# Patient Record
Sex: Female | Born: 1955 | Race: White | Hispanic: No | Marital: Married | Smoking: Current every day smoker
Health system: Southern US, Community
[De-identification: ages and names within clinical notes are randomized; demographics above are authoritative.]

## PROBLEM LIST (undated history)

## (undated) DIAGNOSIS — I219 Acute myocardial infarction, unspecified: Secondary | ICD-10-CM

## (undated) DIAGNOSIS — I10 Essential (primary) hypertension: Secondary | ICD-10-CM

## (undated) DIAGNOSIS — I509 Heart failure, unspecified: Secondary | ICD-10-CM

## (undated) DIAGNOSIS — G8929 Other chronic pain: Secondary | ICD-10-CM

## (undated) DIAGNOSIS — R51 Headache: Secondary | ICD-10-CM

## (undated) DIAGNOSIS — R519 Headache, unspecified: Secondary | ICD-10-CM

## (undated) DIAGNOSIS — I2510 Atherosclerotic heart disease of native coronary artery without angina pectoris: Secondary | ICD-10-CM

## (undated) HISTORY — PX: TUBAL LIGATION: SHX77

## (undated) HISTORY — PX: CARDIAC CATHETERIZATION: SHX172

## (undated) HISTORY — PX: CARDIAC ELECTROPHYSIOLOGY STUDY AND ABLATION: SHX1294

---

## 2007-04-29 ENCOUNTER — Ambulatory Visit (HOSPITAL_COMMUNITY): Admission: RE | Admit: 2007-04-29 | Discharge: 2007-04-29 | Payer: Self-pay | Admitting: Nurse Practitioner

## 2009-03-14 ENCOUNTER — Ambulatory Visit: Payer: Self-pay | Admitting: Gastroenterology

## 2013-11-10 ENCOUNTER — Encounter (HOSPITAL_COMMUNITY): Payer: Self-pay | Admitting: Emergency Medicine

## 2013-11-10 ENCOUNTER — Emergency Department (HOSPITAL_COMMUNITY)
Admission: EM | Admit: 2013-11-10 | Discharge: 2013-11-10 | Disposition: A | Discharge status: Home / Self Care | Payer: BC Managed Care – PPO | Attending: Emergency Medicine | Admitting: Emergency Medicine

## 2013-11-10 ENCOUNTER — Emergency Department (HOSPITAL_COMMUNITY): Payer: BC Managed Care – PPO

## 2013-11-10 DIAGNOSIS — I252 Old myocardial infarction: Secondary | ICD-10-CM | POA: Insufficient documentation

## 2013-11-10 DIAGNOSIS — Z9114 Patient's other noncompliance with medication regimen: Secondary | ICD-10-CM

## 2013-11-10 DIAGNOSIS — Z79899 Other long term (current) drug therapy: Secondary | ICD-10-CM | POA: Insufficient documentation

## 2013-11-10 DIAGNOSIS — I10 Essential (primary) hypertension: Secondary | ICD-10-CM | POA: Insufficient documentation

## 2013-11-10 DIAGNOSIS — Z9119 Patient's noncompliance with other medical treatment and regimen: Secondary | ICD-10-CM | POA: Insufficient documentation

## 2013-11-10 DIAGNOSIS — I2510 Atherosclerotic heart disease of native coronary artery without angina pectoris: Secondary | ICD-10-CM | POA: Insufficient documentation

## 2013-11-10 DIAGNOSIS — R51 Headache: Secondary | ICD-10-CM | POA: Insufficient documentation

## 2013-11-10 DIAGNOSIS — Z9889 Other specified postprocedural states: Secondary | ICD-10-CM | POA: Insufficient documentation

## 2013-11-10 DIAGNOSIS — Z91199 Patient's noncompliance with other medical treatment and regimen due to unspecified reason: Secondary | ICD-10-CM | POA: Insufficient documentation

## 2013-11-10 DIAGNOSIS — G8929 Other chronic pain: Secondary | ICD-10-CM | POA: Insufficient documentation

## 2013-11-10 DIAGNOSIS — F17200 Nicotine dependence, unspecified, uncomplicated: Secondary | ICD-10-CM | POA: Insufficient documentation

## 2013-11-10 DIAGNOSIS — I509 Heart failure, unspecified: Secondary | ICD-10-CM | POA: Insufficient documentation

## 2013-11-10 HISTORY — DX: Heart failure, unspecified: I50.9

## 2013-11-10 HISTORY — DX: Headache: R51

## 2013-11-10 HISTORY — DX: Other chronic pain: G89.29

## 2013-11-10 HISTORY — DX: Headache, unspecified: R51.9

## 2013-11-10 HISTORY — DX: Acute myocardial infarction, unspecified: I21.9

## 2013-11-10 HISTORY — DX: Atherosclerotic heart disease of native coronary artery without angina pectoris: I25.10

## 2013-11-10 HISTORY — DX: Essential (primary) hypertension: I10

## 2013-11-10 LAB — CBC WITH DIFFERENTIAL/PLATELET
Basophils Absolute: 0.1 10*3/uL (ref 0.0–0.1)
Basophils Relative: 1 % (ref 0–1)
EOS PCT: 1 % (ref 0–5)
Eosinophils Absolute: 0.1 10*3/uL (ref 0.0–0.7)
HEMATOCRIT: 40.1 % (ref 36.0–46.0)
HEMOGLOBIN: 13.9 g/dL (ref 12.0–15.0)
LYMPHS ABS: 1.8 10*3/uL (ref 0.7–4.0)
LYMPHS PCT: 25 % (ref 12–46)
MCH: 36.3 pg — ABNORMAL HIGH (ref 26.0–34.0)
MCHC: 34.7 g/dL (ref 30.0–36.0)
MCV: 104.7 fL — AB (ref 78.0–100.0)
MONO ABS: 0.4 10*3/uL (ref 0.1–1.0)
MONOS PCT: 6 % (ref 3–12)
Neutro Abs: 4.7 10*3/uL (ref 1.7–7.7)
Neutrophils Relative %: 67 % (ref 43–77)
PLATELETS: 229 10*3/uL (ref 150–400)
RBC: 3.83 MIL/uL — AB (ref 3.87–5.11)
RDW: 14.3 % (ref 11.5–15.5)
WBC: 7.1 10*3/uL (ref 4.0–10.5)

## 2013-11-10 LAB — URINALYSIS, ROUTINE W REFLEX MICROSCOPIC
Bilirubin Urine: NEGATIVE
Glucose, UA: NEGATIVE mg/dL
HGB URINE DIPSTICK: NEGATIVE
Ketones, ur: NEGATIVE mg/dL
Leukocytes, UA: NEGATIVE
Nitrite: NEGATIVE
Protein, ur: NEGATIVE mg/dL
SPECIFIC GRAVITY, URINE: 1.01 (ref 1.005–1.030)
UROBILINOGEN UA: 0.2 mg/dL (ref 0.0–1.0)
pH: 5.5 (ref 5.0–8.0)

## 2013-11-10 LAB — TROPONIN I

## 2013-11-10 LAB — COMPREHENSIVE METABOLIC PANEL
ALBUMIN: 3.9 g/dL (ref 3.5–5.2)
ALT: 26 U/L (ref 0–35)
AST: 51 U/L — ABNORMAL HIGH (ref 0–37)
Alkaline Phosphatase: 74 U/L (ref 39–117)
BUN: 11 mg/dL (ref 6–23)
CO2: 28 mEq/L (ref 19–32)
CREATININE: 0.86 mg/dL (ref 0.50–1.10)
Calcium: 9.6 mg/dL (ref 8.4–10.5)
Chloride: 97 mEq/L (ref 96–112)
GFR calc Af Amer: 87 mL/min — ABNORMAL LOW (ref >90)
GFR calc non Af Amer: 75 mL/min — ABNORMAL LOW (ref >90)
Glucose, Bld: 106 mg/dL — ABNORMAL HIGH (ref 70–99)
Potassium: 3.2 mEq/L — ABNORMAL LOW (ref 3.7–5.3)
Sodium: 140 mEq/L (ref 137–147)
TOTAL PROTEIN: 8 g/dL (ref 6.0–8.3)
Total Bilirubin: 0.4 mg/dL (ref 0.3–1.2)

## 2013-11-10 LAB — PRO B NATRIURETIC PEPTIDE: Pro B Natriuretic peptide (BNP): 365.3 pg/mL — ABNORMAL HIGH (ref 0–125)

## 2013-11-10 MED ORDER — KETOROLAC TROMETHAMINE 60 MG/2ML IM SOLN
30.0000 mg | Freq: Once | INTRAMUSCULAR | Status: AC
  Administered 2013-11-10: 30 mg via INTRAMUSCULAR
  Filled 2013-11-10: qty 2

## 2013-11-10 MED ORDER — METOCLOPRAMIDE HCL 5 MG/ML IJ SOLN
10.0000 mg | Freq: Once | INTRAMUSCULAR | Status: AC
  Administered 2013-11-10: 10 mg via INTRAMUSCULAR
  Filled 2013-11-10: qty 2

## 2013-11-10 MED ORDER — OXYCODONE-ACETAMINOPHEN 5-325 MG PO TABS
2.0000 | ORAL_TABLET | Freq: Once | ORAL | Status: AC
  Administered 2013-11-10: 2 via ORAL
  Filled 2013-11-10: qty 2

## 2013-11-10 MED ORDER — OXYCODONE-ACETAMINOPHEN 5-325 MG PO TABS: ORAL_TABLET | ORAL | Status: DC

## 2013-11-10 MED ORDER — PROMETHAZINE HCL 25 MG PO TABS: 25.0000 mg | ORAL_TABLET | Freq: Four times a day (QID) | ORAL | Status: DC | PRN

## 2013-11-10 MED ORDER — POTASSIUM CHLORIDE 20 MEQ/15ML (10%) PO LIQD
40.0000 meq | Freq: Once | ORAL | Status: AC
  Administered 2013-11-10: 40 meq via ORAL
  Filled 2013-11-10: qty 30

## 2013-11-10 MED ORDER — FUROSEMIDE 40 MG PO TABS: 20.0000 mg | ORAL_TABLET | Freq: Once | ORAL | Status: DC

## 2013-11-10 MED ORDER — DIPHENHYDRAMINE HCL 50 MG/ML IJ SOLN
50.0000 mg | Freq: Once | INTRAMUSCULAR | Status: AC
  Administered 2013-11-10: 50 mg via INTRAMUSCULAR
  Filled 2013-11-10: qty 1

## 2013-11-10 NOTE — ED Provider Notes (Signed)
CSN: [id number]     Arrival date & time 11/10/13  1335 History   First MD Initiated Contact with Patient 11/10/13 1453     Chief Complaint  Patient presents with  . Hypertension     HPI Pt was seen at 1455.  Per pt, c/o gradual onset and persistence of constant acute flair of her chronic "headache" for the past several months, worse over the past several weeks. Describes the headache as per her usual chronic headache pain pattern "for as long as I can remember." Describes the headache as "dull," located on her vertex scalp and forehead areas.  Denies headache was sudden or maximal in onset or at any time. Denies taking any pain meds for her headache. Denies visual changes, no focal motor weakness, no tingling/numbness in extremities, no fevers, no neck pain, no rash. States she has also noticed her home BP "has been high" for the past several weeks, and her bilat ankles have been "swollen" for the past several months. States she has been taking her BP daily in the evening "while watching TV." Admits to not taking her BP meds as prescribed: not taking her lasix and "either takes a whole or a half tablet of losartan depending on how I feel." States her ankle swelling increases when she is "on my feet a lot" and improves with elevation and "when I take my water pill." States she has not been wearing her compression stockings "for a while now." Pt endorses she has not called her PMD to be evaluated for these ongoing/chronic medical issues. Denies CP/palpitations, no SOB/cough, no abd pain, no N/V/D, no calf/LE pain or unilateral swelling.     PMD: Dr. Wendy Poet [place], [place]) Past Medical History  Diagnosis Date  . Hypertension   . CHF (congestive heart failure)   . Coronary artery disease   . Myocardial infarction   . Chronic headaches     "for as long as I can remember"   Past Surgical History  Procedure Laterality Date  . Tubal ligation    . Cardiac electrophysiology study and  ablation    . Cardiac catheterization      History  Substance Use Topics  . Smoking status: Current Every Day Smoker  . Smokeless tobacco: Not on file  . Alcohol Use: Yes    Review of Systems ROS: Statement: All systems negative except as marked or noted in the HPI; Constitutional: Negative for fever and chills. ; ; Eyes: Negative for eye pain, redness and discharge. ; ; ENMT: Negative for ear pain, hoarseness, nasal congestion, sinus pressure and sore throat. ; ; Cardiovascular: Negative for chest pain, palpitations, diaphoresis, dyspnea and +peripheral edema. ; ; Respiratory: Negative for cough, wheezing and stridor. ; ; Gastrointestinal: Negative for nausea, vomiting, diarrhea, abdominal pain, blood in stool, hematemesis, jaundice and rectal bleeding. . ; ; Genitourinary: Negative for dysuria, flank pain and hematuria. ; ; Musculoskeletal: Negative for back pain and neck pain. Negative for swelling and trauma.; ; Skin: Negative for pruritus, rash, abrasions, blisters, bruising and skin lesion.; ; Neuro: +headache. Negative for lightheadedness and neck stiffness. Negative for weakness, altered level of consciousness , altered mental status, extremity weakness, paresthesias, involuntary movement, seizure and syncope.     Allergies  Review of patient's allergies indicates no known allergies.  Home Medications   Current Outpatient Rx  Name  Route  Sig  Dispense  Refill  . acetaminophen (TYLENOL) 500 MG tablet   Oral   Take 500 mg by  mouth every 6 (six) hours as needed for mild pain or moderate pain.         . Aspirin-Salicylamide-Caffeine (BC HEADACHE) 325-95-16 MG TABS   Oral   Take 1-2 packets by mouth daily as needed (for pain).         . furosemide (LASIX) 20 MG tablet   Oral   Take 20 mg by mouth daily.         Marland Kitchen. losartan (COZAAR) 100 MG tablet   Oral   Take 100 mg by mouth daily.          BP 210/125  Pulse 115  Temp(Src) 97.6 F (36.4 C) (Oral)  Resp 22  Ht 6'  (1.829 m)  Wt 230 lb (104.327 kg)  BMI 31.19 kg/m2  SpO2 98% BP 167/104  Pulse 84  Temp(Src) 97.6 F (36.4 C) (Oral)  Resp 20  Ht 6' (1.829 m)  Wt 230 lb (104.327 kg)  BMI 31.19 kg/m2  SpO2 97%  Physical Exam 1500: Physical examination:  Nursing notes reviewed; Vital signs and O2 SAT reviewed;  Constitutional: Well developed, Well nourished, Well hydrated, In no acute distress; Head:  Normocephalic, atraumatic; Eyes: EOMI, PERRL, No scleral icterus; ENMT: TM's clear bilat. +edemetous nasal turbinates bilat with clear rhinorrhea. Mouth and pharynx normal, Mucous membranes moist; Neck: Supple, Full range of motion, No lymphadenopathy; Cardiovascular: Regular rate and rhythm, No gallop; Respiratory: Breath sounds clear & equal bilaterally, No rales, rhonchi, wheezes.  Speaking full sentences with ease, Normal respiratory effort/excursion; Chest: Nontender, Movement normal; Abdomen: Soft, Nontender, Nondistended, Normal bowel sounds; Genitourinary: No CVA tenderness; Extremities: Pulses normal, No tenderness, +1 pedal edema to ankles bilat without calf asymmetry.; Neuro: AA&Ox3, Major CN grossly intact.Speech clear.  No facial droop.  No nystagmus. Grips equal. Strength 5/5 equal bilat UE's and LE's.  DTR 2/4 equal bilat UE's and LE's.  No gross sensory deficits.  Normal cerebellar testing bilat UE's (finger-nose) and LE's (heel-shin). Climbs on and off stretcher easily by herself. Gait steady..; Skin: Color normal, Warm, Dry.   ED Course  Procedures    EKG Interpretation   Date/Time:  Friday November 10 2013 13:42:59 EST Ventricular Rate:  83 PR Interval:  142 QRS Duration: 74 QT Interval:  378 QTC Calculation: 444 R Axis:   42 Text Interpretation:  Normal sinus rhythm Normal ECG No previous ECGs  available Confirmed by [facility]  MD, Nicholos JohnsKATHLEEN [phone]([id number]) on 11/10/2013 3:15:51  PM      MDM  MDM Reviewed: previous chart, nursing note and vitals Interpretation: labs, ECG, x-ray and CT  scan   Results for orders placed during the hospital encounter of 11/10/13  COMPREHENSIVE METABOLIC PANEL      Result Value Ref Range   Sodium 140  137 - 147 mEq/L   Potassium 3.2 (*) 3.7 - 5.3 mEq/L   Chloride 97  96 - 112 mEq/L   CO2 28  19 - 32 mEq/L   Glucose, Bld 106 (*) 70 - 99 mg/dL   BUN 11  6 - 23 mg/dL   Creatinine, Ser 6.040.86  0.50 - 1.10 mg/dL   Calcium 9.6  8.4 - 54.010.5 mg/dL   Total Protein 8.0  6.0 - 8.3 g/dL   Albumin 3.9  3.5 - 5.2 g/dL   AST 51 (*) 0 - 37 U/L   ALT 26  0 - 35 U/L   Alkaline Phosphatase 74  39 - 117 U/L   Total Bilirubin 0.4  0.3 - 1.2 mg/dL  GFR calc non Af Amer 75 (*) >90 mL/min   GFR calc Af Amer 87 (*) >90 mL/min  CBC WITH DIFFERENTIAL      Result Value Ref Range   WBC 7.1  4.0 - 10.5 K/uL   RBC 3.83 (*) 3.87 - 5.11 MIL/uL   Hemoglobin 13.9  12.0 - 15.0 g/dL   HCT 72.5  36.6 - 44.0 %   MCV 104.7 (*) 78.0 - 100.0 fL   MCH 36.3 (*) 26.0 - 34.0 pg   MCHC 34.7  30.0 - 36.0 g/dL   RDW 34.7  42.5 - 95.6 %   Platelets 229  150 - 400 K/uL   Neutrophils Relative % 67  43 - 77 %   Neutro Abs 4.7  1.7 - 7.7 K/uL   Lymphocytes Relative 25  12 - 46 %   Lymphs Abs 1.8  0.7 - 4.0 K/uL   Monocytes Relative 6  3 - 12 %   Monocytes Absolute 0.4  0.1 - 1.0 K/uL   Eosinophils Relative 1  0 - 5 %   Eosinophils Absolute 0.1  0.0 - 0.7 K/uL   Basophils Relative 1  0 - 1 %   Basophils Absolute 0.1  0.0 - 0.1 K/uL  TROPONIN I      Result Value Ref Range   Troponin I <0.30  <0.30 ng/mL  PRO B NATRIURETIC PEPTIDE      Result Value Ref Range   Pro B Natriuretic peptide (BNP) 365.3 (*) 0 - 125 pg/mL  URINALYSIS, ROUTINE W REFLEX MICROSCOPIC      Result Value Ref Range   Color, Urine YELLOW  YELLOW   APPearance CLEAR  CLEAR   Specific Gravity, Urine 1.010  1.005 - 1.030   pH 5.5  5.0 - 8.0   Glucose, UA NEGATIVE  NEGATIVE mg/dL   Hgb urine dipstick NEGATIVE  NEGATIVE   Bilirubin Urine NEGATIVE  NEGATIVE   Ketones, ur NEGATIVE  NEGATIVE mg/dL   Protein,  ur NEGATIVE  NEGATIVE mg/dL   Urobilinogen, UA 0.2  0.0 - 1.0 mg/dL   Nitrite NEGATIVE  NEGATIVE   Leukocytes, UA NEGATIVE  NEGATIVE   Dg Chest 2 View 11/10/2013   CLINICAL DATA:  Cough, congestion shortness of breath.  EXAM: CHEST  2 VIEW  COMPARISON:  None.  FINDINGS: Heart size and mediastinal contours are within normal limits. Both lungs are clear. Visualized skeletal structures are unremarkable.  IMPRESSION: Negative exam.   Electronically Signed   By: Drusilla Kanner M.D.   On: 11/10/2013 14:07   Ct Head Wo Contrast 11/10/2013   CLINICAL DATA:  Elevated blood pressure.  Hypertension.  EXAM: CT HEAD WITHOUT CONTRAST  TECHNIQUE: Contiguous axial images were obtained from the base of the skull through the vertex without intravenous contrast.  COMPARISON:  None.  FINDINGS: No mass lesion, mass effect, midline shift, hydrocephalus, hemorrhage. No territorial ischemia or acute infarction. Right-sided nasal septal spur are incidentally noted.  IMPRESSION: Negative CT head.   Electronically Signed   By: Andreas Newport M.D.   On: 11/10/2013 15:28     1805:  Endorses she "took all my meds today" PTA because she was "concerned about my blood pressure." Pt's BP slowly improving after pain meds. Neuro exam remains intact and unchanged, has been ambulatory with steady gait. Has tol PO well without N/V. States she is ready to go home now. Pt strongly encouraged to keep a BP diary, take her BP meds as prescribed, and f/u regularly  with her PMD for good continuity of care and control of her chronic medical issues. Dx and testing d/w pt and family.  Questions answered.  Verb understanding, agreeable to d/c home with outpt f/u.       Laray Anger, DO 11/13/13 1546

## 2013-11-10 NOTE — ED Notes (Addendum)
Headache for several days intermittently.  Checked her bp and it was elevated.   Swelling of legs

## 2013-11-10 NOTE — Discharge Instructions (Signed)
°Emergency Department Resource Guide °1) Find a Doctor and Pay Out of Pocket °Although you won't have to find out who is covered by your insurance plan, it is a good idea to ask around and get recommendations. You will then need to call the office and see if the doctor you have chosen will accept you as a new patient and what types of options they offer for patients who are self-pay. Some doctors offer discounts or will set up payment plans for their patients who do not have insurance, but you will need to ask so you aren't surprised when you get to your appointment. ° °2) Contact Your Local Health Department °Not all health departments have doctors that can see patients for sick visits, but many do, so it is worth a call to see if yours does. If you don't know where your local health department is, you can check in your phone book. The CDC also has a tool to help you locate your state's health department, and many state websites also have listings of all of their local health departments. ° °3) Find a Walk-in Clinic °If your illness is not likely to be very severe or complicated, you may want to try a walk in clinic. These are popping up all over the country in pharmacies, drugstores, and shopping centers. They're usually staffed by nurse practitioners or physician assistants that have been trained to treat common illnesses and complaints. They're usually fairly quick and inexpensive. However, if you have serious medical issues or chronic medical problems, these are probably not your best option. ° °No Primary Care Doctor: °- Call [facility] Connect at  832-8000 - they can help you locate a primary care doctor that  accepts your insurance, provides certain services, etc. °- Physician Referral Service- 1-800-533-3463 ° °Chronic Pain Problems: °Organization         Address  Phone   Notes  °[facility]  ([phone]) [phone] Patients need to be referred by their primary care doctor.  ° °Medication  Assistance: °Organization         Address  Phone   Notes  °[place] [facility] [address] °[place], [place] [address] (336) 641-8030 --Must be a resident of [place] °-- Must have NO insurance coverage whatsoever (no Medicaid/ Medicare, etc.) °-- The pt. MUST have a primary care doctor that directs their care regularly and follows them in the community °  °[facility]  (866) 331-1348   °[employer]  (888) 892-1162   ° °Agencies that provide inexpensive medical care: °Organization         Address  Phone   Notes  °[facility]  (336) 832-8035   °[facility]    (336) 832-7272   °[facility] [address] °[place], [place] [address] (336) 832-4777   °[facility] [address], °[place] (336) 271-4999   °[facility]    (336) 373-0678   °[facility]    (336) 272-1050   °[facility] ° [address], [place] Phone:  (336) 832-4444, Fax:  (336) 832-4440 Hours of Operation:  9 am - 6 pm, M-F.  Also accepts Medicaid/Medicare and self-pay.  °[facility] ° [address], [place] Phone: (336) [phone], Fax: (336) [phone]. Hours of Operation:  8:30 am - 5:30 pm, M-F.  Also accepts Medicaid and self-pay.  °[facility] [place] [address], [place] Phone: (336) 878-6027   °[facility] [address], [place], [place] (336)723-1848, Ext. 123 Mondays & Thursdays: 7-9 AM.  First 15 patients are seen on a first come, first serve basis. °  ° °Medicaid-accepting [place] Providers: ° °Organization         Address  Phone   Notes  °[facility] [address], [place] (336) 641-2100 Also accepts self-pay patients.  °[facility] [address], [place] ° (336) 856-9996   °[facility] [address], [place]  (336) 288-8857   °[facility] [address] [place] [address], [place] (336) 299-7000   °Veita Bland [address], [place]  ° (336) 373-1557 Only accepts [place] Access Medicaid patients after they have their name applied to their card.  ° °Self-Pay (no insurance) in [place]: ° °Organization         Address  Phone   Notes  °Sickle Cell Patients, [facility] [address], [place] (336) 832-1970   °[facility] [address], [place] (336) 832-4400   °[facility] ° [address], [place] (336) 992-4800   °[facility]/Dr. Osei-Bonsu ° [address] [place] [address], [place] or [address], [place] (336) [phone] Phone number for both [place] and [place] locations is the same.  °[facility] [address], [place] (336) 299-0000   °[facility] [address] [place] [address], [place] or [address] (336) 852-7530 °(336) 878-2260   °[facility] [address], [place] (336) 350-1642, phone; (336) 294-5005, fax Sees patients 1st and 3rd Saturday of every month.  Must not qualify for public or private insurance (i.e. Medicaid, Medicare, [place] Health Choice, Veterans' Benefits) • Household income should be no more than 200% of the poverty level •The clinic cannot treat you if you are pregnant or think you are pregnant • Sexually transmitted diseases are not treated at the clinic.  ° ° °Dental Care: °Organization         Address  Phone  Notes  °[place] [facility] [address], [place] (336) 641-6152 Accepts children up to age 21 who are enrolled in Medicaid or [place] Health Choice; pregnant women with a Medicaid card; and children who have applied for Medicaid or [place] Health Choice, but were declined, whose parents can pay a reduced fee at time of service.  °[place]  [facility] [place]  [address], [place] (336) 641-7733 Accepts children up to age 21 who are enrolled in Medicaid or [place] Health Choice; pregnant women with a Medicaid card; and children who have applied for Medicaid or [place] Health Choice, but were declined, whose parents can pay a reduced fee at time of service.  °[facility] ° [address], [place] (336) 641-4533 Patients are seen by appointment only. Walk-ins are not accepted. [facility] will see patients 18 years of age and older. °Monday - Tuesday (8am-5pm) °Most Wednesdays (8:30-5pm) °$30 per visit, cash only  °[facility] ° [address], [place] (336) 641-4533 Patients are seen by appointment only. Walk-ins are not accepted. [facility] will see patients 18 years of age and older. °One   Wednesday Evening (Monthly: Volunteer Based).  $30 per visit, cash only  °[facility]  (919) 537-3737 for adults; Children under age 4, call Graduate Pediatric Dentistry at (919) [phone]. Children aged 4-14, please call (919) [phone] to request a pediatric application. ° Dental services are provided in all areas of dental care including fillings, crowns and bridges, complete and partial dentures, implants, gum treatment, root canals, and extractions. Preventive care is also provided. Treatment is provided to both adults and children. °Patients are selected via a lottery and there is often a waiting list. °  °[facility] [address], °[place] ° (336) 763-8833 [email or web address].[email or web address] °  °[facility] [address], [place], [place] (336)723-1848, Ext. 123 Second and Fourth Thursday of each month, opens at 6:30 AM; Clinic ends at 9 AM.  Patients are seen on a first-come first-served basis, and a limited number are seen during each clinic.  ° °[facility] ° [address], [place], [place] (336) [phone]    Eligibility Requirements °You must have lived in [place], or [place] counties for at least the last three months. °  You cannot be eligible for state or federal sponsored healthcare insurance, including Veterans Administration, Medicaid, or Medicare. °  You generally cannot be eligible for healthcare insurance through your employer.  °  How to apply: °Eligibility screenings are held every Tuesday and Wednesday afternoon from 1:00 pm until 4:00 pm. You do not need an appointment for the interview!  °[facility] [address], [place], [place] 336-631-2330   °[facility]  336-342-8273   °[facility]  336-703-3100   °[facility]  336-570-6415   ° °Behavioral Health Resources in the Community: °Intensive Outpatient Programs °Organization         Address  Phone  Notes  °[facility] [address], [place], [place] 336-878-6098   °[facility] [address], [place], [place] 336-832-9800   °[facility] [address], [place], [place] ° 336-882-2125   °[facility] [address],  °[place], [place] 1-800-853-5163 or 336-641-4981   °Substance Abuse Resources °Organization         Address  Phone  Notes  °[facility]  336-882-2125   °[facility]  336-784-9470   °[facility]  336-285-9073   °[facility]  336-845-3988   °Residential & Outpatient Substance Abuse Program  1-800-659-3381   °Psychological Services °Organization         Address  Phone  Notes  °[facility]  336- 832-9600   °[facility]  336- 378-7881   °[facility] [address], [place] 1-800-853-5163 or 336-641-4981   ° °Mobile Crisis Teams °Organization         Address  Phone  Notes  °[facility], Mobile Crisis Care Unit  1-877-626-1772   °Assertive °[facility] ° [address]  [place], [place] 336-834-9664   °Sharon DeEsch [address] °[place] 336-554-5454   ° °Self-Help/Support Groups °Organization         Address  Phone             Notes  °[facility]  - variety of support groups  336- 373-1402 Call for more information  °Narcotics Anonymous (NA), [facility] [address], °[place]  2 meetings at this location  ° °  Residential Treatment Programs Organization         Address  Phone  Notes  [facility] [address]    [place]  8-828-003-4917   [facility]  [address] [place] [address], [place], [place] 979-480-1655   [facility] [address] [place], [place] 374-827-0786 Admissions: 8am-3pm M-F  [facility] [address],    [place], [place] 754-492-0100   [facility] [address] [place], [place], [place] 712-197-5883   [facility] [address],  [place], [place] 254-982-6415   [facility] Intensive Outpatient [address], [place], [place] 830-940-7680   [facility] ([facility] [address] [place]  [place], [place] 8-811-031-5945 or 740-152-7051   [facility] ([facility] [address], [place], [place] 863-817-7116 Accepts Medicaid  [facility] [address]  [place] 5-790-383-3383 Substance Abuse/Addiction Treatment   [facility]         Address  Phone  Notes  [facility]  (602)681-0222   Angie Fava, PhD [address] [place], [place]   (715)648-4910 or 3047612633   [facility]   [address] [place], [place] 267 148 2029   [facility] [address] [place], [place] [phone] Insurance/Medicaid/sponsorship through [facility] and Families [address]                                    [place], [place] [phone] Therapy/tele-psych/case    [facility] [address] [place] [address], [place] 817-330-6078    Dr. Lolly Mustache  843-214-2630   [facility]  [employer] [facility] Dept. 1) [address] [place] 2) [address] [place] 3)  [address], [place] [phone] 303-083-7858  (224)040-2542   [facility] Child Abuse Hotline 6301841502 or (954) 502-6387 (After Hours)      Take your usual prescriptions as previously directed.  Take your blood pressure only ONCE per day, either in the morning after you take your medicine(s) or in the evening before you go to bed.  Always sit quietly for at least 15 minutes before taking your blood pressure.  Keep a diary of your blood pressures to show your doctor at your follow up office visit. Take over the counter tylenol, ibuprofen (OR excedrin, OR percocet) and benadryl, as directed on packaging, with the anti-nausea prescription given to you today (phenergan), as needed for headache. Do NOT take percocet if you have already taken tylenol because percocet already has tylenol in it.  Call your regular medical doctor on Monday morning to schedule a follow up appointment in the next 3 days.  Return to the Emergency Department immediately sooner if worsening.

## 2016-07-02 ENCOUNTER — Emergency Department (HOSPITAL_COMMUNITY): Payer: Self-pay

## 2016-07-02 ENCOUNTER — Encounter (HOSPITAL_COMMUNITY): Payer: Self-pay | Admitting: Emergency Medicine

## 2016-07-02 ENCOUNTER — Emergency Department (HOSPITAL_COMMUNITY)
Admission: EM | Admit: 2016-07-02 | Discharge: 2016-07-03 | Disposition: A | Discharge status: Home / Self Care | Payer: Self-pay | Attending: Emergency Medicine | Admitting: Emergency Medicine

## 2016-07-02 DIAGNOSIS — I10 Essential (primary) hypertension: Secondary | ICD-10-CM

## 2016-07-02 DIAGNOSIS — I2510 Atherosclerotic heart disease of native coronary artery without angina pectoris: Secondary | ICD-10-CM | POA: Insufficient documentation

## 2016-07-02 DIAGNOSIS — Z7982 Long term (current) use of aspirin: Secondary | ICD-10-CM | POA: Insufficient documentation

## 2016-07-02 DIAGNOSIS — R079 Chest pain, unspecified: Secondary | ICD-10-CM | POA: Insufficient documentation

## 2016-07-02 DIAGNOSIS — R519 Headache, unspecified: Secondary | ICD-10-CM

## 2016-07-02 DIAGNOSIS — I110 Hypertensive heart disease with heart failure: Secondary | ICD-10-CM | POA: Insufficient documentation

## 2016-07-02 DIAGNOSIS — Z79899 Other long term (current) drug therapy: Secondary | ICD-10-CM | POA: Insufficient documentation

## 2016-07-02 DIAGNOSIS — F17200 Nicotine dependence, unspecified, uncomplicated: Secondary | ICD-10-CM | POA: Insufficient documentation

## 2016-07-02 DIAGNOSIS — R51 Headache: Secondary | ICD-10-CM | POA: Insufficient documentation

## 2016-07-02 DIAGNOSIS — I509 Heart failure, unspecified: Secondary | ICD-10-CM | POA: Insufficient documentation

## 2016-07-02 LAB — CBC
HCT: 40.7 % (ref 36.0–46.0)
HEMOGLOBIN: 13.8 g/dL (ref 12.0–15.0)
MCH: 35 pg — AB (ref 26.0–34.0)
MCHC: 33.9 g/dL (ref 30.0–36.0)
MCV: 103.3 fL — ABNORMAL HIGH (ref 78.0–100.0)
PLATELETS: 79 10*3/uL — AB (ref 150–400)
RBC: 3.94 MIL/uL (ref 3.87–5.11)
RDW: 14.2 % (ref 11.5–15.5)
WBC: 6.5 10*3/uL (ref 4.0–10.5)

## 2016-07-02 LAB — BASIC METABOLIC PANEL
ANION GAP: 13 (ref 5–15)
BUN: 16 mg/dL (ref 6–20)
CALCIUM: 9.7 mg/dL (ref 8.9–10.3)
CO2: 26 mmol/L (ref 22–32)
CREATININE: 1.07 mg/dL — AB (ref 0.44–1.00)
Chloride: 96 mmol/L — ABNORMAL LOW (ref 101–111)
GFR, EST NON AFRICAN AMERICAN: 56 mL/min — AB (ref >60)
GLUCOSE: 116 mg/dL — AB (ref 65–99)
Potassium: 3.1 mmol/L — ABNORMAL LOW (ref 3.5–5.1)
Sodium: 135 mmol/L (ref 135–145)

## 2016-07-02 LAB — D-DIMER, QUANTITATIVE (NOT AT ARMC): D DIMER QUANT: 0.74 ug{FEU}/mL — AB (ref 0.00–0.50)

## 2016-07-02 LAB — TROPONIN I

## 2016-07-02 LAB — BRAIN NATRIURETIC PEPTIDE: B NATRIURETIC PEPTIDE 5: 45 pg/mL (ref 0.0–100.0)

## 2016-07-02 MED ORDER — DIPHENHYDRAMINE HCL 50 MG/ML IJ SOLN
25.0000 mg | Freq: Once | INTRAMUSCULAR | Status: AC
  Administered 2016-07-02: 25 mg via INTRAVENOUS
  Filled 2016-07-02: qty 1

## 2016-07-02 MED ORDER — LORAZEPAM 1 MG PO TABS
1.0000 mg | ORAL_TABLET | Freq: Once | ORAL | Status: AC
  Administered 2016-07-02: 1 mg via ORAL
  Filled 2016-07-02: qty 1

## 2016-07-02 MED ORDER — IOPAMIDOL (ISOVUE-370) INJECTION 76%
100.0000 mL | Freq: Once | INTRAVENOUS | Status: AC | PRN
  Administered 2016-07-02: 100 mL via INTRAVENOUS

## 2016-07-02 MED ORDER — KETOROLAC TROMETHAMINE 30 MG/ML IJ SOLN
30.0000 mg | Freq: Once | INTRAMUSCULAR | Status: AC
  Administered 2016-07-02: 30 mg via INTRAVENOUS
  Filled 2016-07-02: qty 1

## 2016-07-02 MED ORDER — METOCLOPRAMIDE HCL 5 MG/ML IJ SOLN
10.0000 mg | Freq: Once | INTRAMUSCULAR | Status: AC
  Administered 2016-07-02: 10 mg via INTRAVENOUS
  Filled 2016-07-02: qty 2

## 2016-07-02 NOTE — ED Provider Notes (Addendum)
[facility] DEPT Provider Note   CSN: [id number] Arrival date & time: 07/02/16  1855     History   Chief Complaint Chief Complaint  Patient presents with  . Palpitations    HPI Ebony Shaw is a 60 y.o. female.  Patient reports one week of sinus congestion, intermittent headaches and elevated blood pressure. States compliance with her blood pressure medications and no recent changes. Today she's had a diffuse headache with multiple episodes of nausea and vomiting and photosensitivity. Denies any fever. She is also felt her heart racing all day. Denies chest pain or shortness of breath. Reports having an ablation for arrhythmia as well as a cardiac catheterization that showed clear vessels several years ago. She has a history of hypertension and diastolic heart failure and chronic headaches. She denies any falls or trauma. She is not on anticoagulation. She denies any leg pain or leg swelling. She states her emesis has been nonbloody and nonbilious. No diarrhea. No abdominal pain. No chest pain or shortness of breath. No sick contacts. She is also tremulous and shaky and anxious. Denies  or significant alcohol use or illicit drug use. Reports she has chronic headaches, sometimes with nausea and vomiting.  Denies thunderclap onset.    The history is provided by the patient.  Palpitations   Associated symptoms include nausea, vomiting and headaches. Pertinent negatives include no fever, no abdominal pain, no dizziness, no weakness and no shortness of breath.    Past Medical History:  Diagnosis Date  . CHF (congestive heart failure) (HCC)   . Chronic headaches    "for as long as I can remember"  . Coronary artery disease   . Hypertension   . Myocardial infarction     There are no active problems to display for this patient.   Past Surgical History:  Procedure Laterality Date  . CARDIAC CATHETERIZATION    . CARDIAC ELECTROPHYSIOLOGY STUDY AND ABLATION    . TUBAL LIGATION       OB History    No data available       Home Medications    Prior to Admission medications   Medication Sig Start Date End Date Taking? Authorizing Provider  acetaminophen (TYLENOL) 500 MG tablet Take 500 mg by mouth every 6 (six) hours as needed for mild pain or moderate pain.    Historical Provider, MD  Aspirin-Salicylamide-Caffeine (BC HEADACHE) 325-95-16 MG TABS Take 1-2 packets by mouth daily as needed (for pain).    Historical Provider, MD  furosemide (LASIX) 20 MG tablet Take 20 mg by mouth daily.    Historical Provider, MD  losartan (COZAAR) 100 MG tablet Take 100 mg by mouth daily.    Historical Provider, MD  oxyCODONE-acetaminophen (PERCOCET/ROXICET) 5-325 MG per tablet 1 or 2 tabs PO q6h prn pain 11/10/13   Samuel JesterKathleen McManus, DO  promethazine (PHENERGAN) 25 MG tablet Take 1 tablet (25 mg total) by mouth every 6 (six) hours as needed for nausea or vomiting (or headache). 11/10/13   Samuel JesterKathleen McManus, DO    Family History History reviewed. No pertinent family history.  Social History Social History  Substance Use Topics  . Smoking status: Current Every Day Smoker  . Smokeless tobacco: Never Used  . Alcohol use Yes     Allergies   Review of patient's allergies indicates no known allergies.   Review of Systems Review of Systems  Constitutional: Positive for activity change and appetite change. Negative for fatigue and fever.  HENT: Negative for congestion, rhinorrhea and  trouble swallowing.   Respiratory: Negative for chest tightness and shortness of breath.   Cardiovascular: Positive for palpitations.  Gastrointestinal: Positive for nausea and vomiting. Negative for abdominal pain and diarrhea.  Genitourinary: Negative for dysuria, hematuria, vaginal bleeding and vaginal discharge.  Musculoskeletal: Positive for arthralgias and myalgias.  Neurological: Positive for headaches. Negative for dizziness, weakness and light-headedness.   A complete 10 system review of  systems was obtained and all systems are negative except as noted in the HPI and PMH.    Physical Exam Updated Vital Signs BP (!) 187/112 (BP Location: Left Arm)   Pulse 114   Temp 98 F (36.7 C) (Oral)   Resp 24   Ht 5\' 11"  (1.803 m)   Wt 230 lb (104.3 kg)   SpO2 99%   BMI 32.08 kg/m   Physical Exam  Constitutional: She is oriented to person, place, and time. She appears well-developed and well-nourished. No distress.  Anxious, mildly tremulous  HENT:  Head: Normocephalic and atraumatic.  Mouth/Throat: Oropharynx is clear and moist. No oropharyngeal exudate.  Eyes: Conjunctivae and EOM are normal. Pupils are equal, round, and reactive to light.  Neck: Normal range of motion. Neck supple.  No meningismus.  Cardiovascular: Normal rate, normal heart sounds and intact distal pulses.   No murmur heard. Tachycardic to 110s  Pulmonary/Chest: Effort normal and breath sounds normal. No respiratory distress. She exhibits no tenderness.  Abdominal: Soft. There is no tenderness. There is no rebound and no guarding.  Musculoskeletal: Normal range of motion. She exhibits no edema or tenderness.  Neurological: She is alert and oriented to person, place, and time. No cranial nerve deficit. She exhibits normal muscle tone. Coordination normal.   5/5 strength throughout. CN 2-12 intact.Equal grip strength.   Skin: Skin is warm.  Psychiatric: She has a normal mood and affect. Her behavior is normal.  Nursing note and vitals reviewed.    ED Treatments / Results  Labs (all labs ordered are listed, but only abnormal results are displayed) Labs Reviewed  BASIC METABOLIC PANEL - Abnormal; Notable for the following:       Result Value   Potassium 3.1 (*)    Chloride 96 (*)    Glucose, Bld 116 (*)    Creatinine, Ser 1.07 (*)    GFR calc non Af Amer 56 (*)    All other components within normal limits  CBC - Abnormal; Notable for the following:    MCV 103.3 (*)    MCH 35.0 (*)    Platelets  79 (*)    All other components within normal limits  D-DIMER, QUANTITATIVE (NOT AT [facility]) - Abnormal; Notable for the following:    D-Dimer, Quant 0.74 (*)    All other components within normal limits  TROPONIN I  BRAIN NATRIURETIC PEPTIDE  TROPONIN I    EKG  EKG Interpretation None       Radiology Dg Chest 2 View  Result Date: 07/02/2016 CLINICAL DATA:  Headache and elevated blood pressure.  Chest pain. EXAM: CHEST  2 VIEW COMPARISON:  Chest radiograph 11/10/2013 FINDINGS: Normal cardiac contours. There is a roundedB opacity in the right peritracheal region that appears more prominent than on the prior examination. The lungs are clear. No pleural effusion or pneumothorax. IMPRESSION: 1. No acute cardiopulmonary disease. 2. Rounded opacity along the right paratracheal stripe, more prominent than on prior study. Changes may be due to differences in positioning. This may represent a prominent upper thoracic blood vessel, though mediastinal lymphadenopathy  or mass could cause a similar appearance. CT of the chest is recommended for further characterization. Electronically Signed   By: Deatra Robinson M.D.   On: 07/02/2016 19:33   Ct Head Wo Contrast  Result Date: 07/02/2016 CLINICAL DATA:  Tachycardia headache EXAM: CT HEAD WITHOUT CONTRAST TECHNIQUE: Contiguous axial images were obtained from the base of the skull through the vertex without intravenous contrast. COMPARISON:  11/10/2013 FINDINGS: Brain: There is mild sulcal and ventricular prominence for age consistent with atrophy. Minimal small vessel ischemic changes of periventricular white matter, chronic in appearance noted. No significant change from previous exam. No extra-axial fluid, intra-axial mass, midline shift or edema. Vascular: No hyperdense vessel. Minimal right carotid siphon arteriosclerosis. Skull: Negative for acute abnormalities. Sinuses/Orbits: Intact globes. Paranasal sinuses clear. No mastoid effusions. Other: No  skeletal abnormalities. IMPRESSION: No acute intracranial abnormality. Mild atrophy for age. 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 Electronically Signed   By: Tollie Eth M.D.   On: 07/02/2016 22:51   Ct Angio Chest Pe W And/or Wo Contrast  Result Date: 07/02/2016 CLINICAL DATA:  Tachycardia with nausea and vomiting. Positive D-dimer. EXAM: CT ANGIOGRAPHY CHEST WITH CONTRAST TECHNIQUE: Multidetector CT imaging of the chest was performed using the standard protocol during bolus administration of intravenous contrast. Multiplanar CT image reconstructions and MIPs were obtained to evaluate the vascular anatomy. CONTRAST:  100 cc Isovue 370 IV COMPARISON:  07/02/2016 chest radiograph FINDINGS: Cardiovascular: Mild cardiomegaly without pericardial effusion. Mild prominence of epicardial fat. Coronary arteriosclerosis is seen. The aorta is not aneurysmal. There is no aortic dissection. No pulmonary embolus is noted. The right paratracheal rounded nodular density seen on earlier same day chest radiograph is vascular in etiology secondary to the right subclavian vein and right subclavian artery adjacent to each other. Mediastinum/Nodes: No enlarged mediastinal, hilar, or axillary lymph nodes. Thyroid gland, trachea, and esophagus demonstrate no significant findings. Lungs/Pleura: Lungs are clear. No pleural effusion or pneumothorax. Upper Abdomen: Diffuse fatty infiltration of the liver. Gallbladder is physiologic in appearance without stones. Pancreas: Visualized left kidney, both adrenal glands and spleen are normal appearance. Musculoskeletal: Osteophytes are seen along the mid thoracic spine anteriorly. No acute osseous abnormality. No obstruction is noted. Review of the MIP images confirms the above findings. IMPRESSION: No pulmonary embolus.  Fatty liver. Electronically Signed   By: Tollie Eth M.D.   On: 07/02/2016 22:47    Procedures Procedures (including critical  care time)  Medications Ordered in ED Medications  LORazepam (ATIVAN) tablet 1 mg (not administered)     Initial Impression / Assessment and Plan / ED Course  I have reviewed the triage vital signs and the nursing notes.  Pertinent labs & imaging results that were available during my care of the patient were reviewed by me and considered in my medical decision making (see chart for details).  Clinical Course   Patient with elevated blood pressure, headache, palpitations. No chest pain or shortness of breath. Multiple episodes of nausea and vomiting today. Further history taking elicits history of chronic headaches similar to the one she has today. She has frequent nausea and vomiting with her headaches and photosensitivity but has never been diagnosed with migraines.  EKG nsr.  Abnormality on CXR. Labs show mild hypokalemia and thrombocytopenia. Patient mildly tremulous.  Suspect she make drink more alcohol than she admits. PO ativan given.   CT head negative.  Troponin negative x2.  Abnormality on CXR not confirmed on CT.  No PE.  BP improved in the ED to 135/84.  She states compliance with her BP meds.  Discussed keeping BP log at home and followup with PCP for med adjustment. Headache has improved.  Low suspicion for SAH, meningitis, temporal arteritis.  Suspect migraine.  She is tolerating PO.  Refer to neurology for evaluation of chronic headaches and PCP for BP management.  Return precautions discussed.  BP (!) 176/103   Pulse 85   Temp 98 F (36.7 C) (Oral)   Resp 19   Ht 5\' 11"  (1.803 m)   Wt 230 lb (104.3 kg)   SpO2 97%   BMI 32.08 kg/m     ED ECG REPORT   Date: 07/02/2016  Rate: 94  Rhythm: normal sinus rhythm  QRS Axis: normal  Intervals: normal  ST/T Wave abnormalities: normal  Conduction Disutrbances:none  Narrative Interpretation:   Old EKG Reviewed: unchanged  I have personally reviewed the EKG tracing and agree with the computerized printout as  noted.  Final Clinical Impressions(s) / ED Diagnoses   Final diagnoses:  None    New Prescriptions New Prescriptions   No medications on file     Glynn Octave, MD 07/03/16 1053    Glynn Octave, MD 07/03/16 1053

## 2016-07-02 NOTE — ED Triage Notes (Signed)
Pt states her heart has been racing today with n/v and headache.

## 2016-07-03 NOTE — ED Notes (Signed)
Pt given ginger ale to drink. 

## 2016-07-03 NOTE — Discharge Instructions (Signed)
Keep a record of your blood pressure and followup with your doctor. Followup with the neurologist regarding your headaches. Return to the ED if you develop new or worsening symptoms.

## 2017-01-30 DIAGNOSIS — J189 Pneumonia, unspecified organism: Secondary | ICD-10-CM | POA: Insufficient documentation

## 2017-01-30 DIAGNOSIS — E871 Hypo-osmolality and hyponatremia: Secondary | ICD-10-CM | POA: Insufficient documentation

## 2017-01-30 DIAGNOSIS — F1010 Alcohol abuse, uncomplicated: Secondary | ICD-10-CM | POA: Insufficient documentation

## 2018-03-21 DIAGNOSIS — I10 Essential (primary) hypertension: Secondary | ICD-10-CM | POA: Insufficient documentation

## 2020-03-23 DIAGNOSIS — K3580 Unspecified acute appendicitis: Secondary | ICD-10-CM | POA: Insufficient documentation

## 2020-10-28 DIAGNOSIS — F4321 Adjustment disorder with depressed mood: Secondary | ICD-10-CM | POA: Insufficient documentation

## 2020-10-28 DIAGNOSIS — I2510 Atherosclerotic heart disease of native coronary artery without angina pectoris: Secondary | ICD-10-CM | POA: Insufficient documentation

## 2020-10-28 DIAGNOSIS — F17200 Nicotine dependence, unspecified, uncomplicated: Secondary | ICD-10-CM | POA: Insufficient documentation

## 2020-10-28 DIAGNOSIS — I5032 Chronic diastolic (congestive) heart failure: Secondary | ICD-10-CM | POA: Insufficient documentation

## 2021-10-29 ENCOUNTER — Encounter: Payer: Self-pay | Admitting: Orthopedic Surgery

## 2021-10-29 ENCOUNTER — Ambulatory Visit: Payer: Medicare HMO | Admitting: Orthopedic Surgery

## 2021-10-29 ENCOUNTER — Other Ambulatory Visit: Payer: Self-pay

## 2021-10-29 ENCOUNTER — Ambulatory Visit: Payer: Medicare HMO

## 2021-10-29 VITALS — BP 142/88 | HR 70 | Ht 71.0 in | Wt 178.0 lb

## 2021-10-29 DIAGNOSIS — M1712 Unilateral primary osteoarthritis, left knee: Secondary | ICD-10-CM | POA: Diagnosis not present

## 2021-10-29 DIAGNOSIS — M7052 Other bursitis of knee, left knee: Secondary | ICD-10-CM

## 2021-10-29 DIAGNOSIS — M25562 Pain in left knee: Secondary | ICD-10-CM

## 2021-10-29 NOTE — Patient Instructions (Addendum)
Voltaren gel on your knee   Instructions Following Joint Injections  In clinic today, you received an injection in one of your joints (sometimes more than one).  Occasionally, you can have some pain at the injection site, this is normal.  You can place ice at the injection site, or take over-the-counter medications such as Tylenol (acetaminophen) or Advil (ibuprofen).  Please follow all directions listed on the bottle.  If your joint (knee or shoulder) becomes swollen, red or very painful, please contact the clinic for additional assistance.   Two medications were injected, including lidocaine and a steroid (often referred to as cortisone).  Lidocaine is effective almost immediately but wears off quickly.  However, the steroid can take a few days to improve your symptoms.  In some cases, it can make your pain worse for a couple of days.  Do not be concerned if this happens as it is common.  You can apply ice or take some over-the-counter medications as needed.      Knee Exercises  Ask your health care provider which exercises are safe for you. Do exercises exactly as told by your health care provider and adjust them as directed. It is normal to feel mild stretching, pulling, tightness, or discomfort as you do these exercises. Stop right away if you feel sudden pain or your pain gets worse. Do not begin these exercises until told by your health care provider.  Stretching and range-of-motion exercises These exercises warm up your muscles and joints and improve the movement and flexibility of your knee. These exercises also help to relieve pain and swelling.  Knee extension, prone Lie on your abdomen (prone position) on a bed. Place your left / right knee just beyond the edge of the surface so your knee is not on the bed. You can put a towel under your left / right thigh just above your kneecap for comfort. Relax your leg muscles and allow gravity to straighten your knee (extension). You should  feel a stretch behind your left / right knee. Hold this position for 10 seconds. Scoot up so your knee is supported between repetitions. Repeat 10 times. Complete this exercise 3-4 times per week.     Knee flexion, active Lie on your back with both legs straight. If this causes back discomfort, bend your left / right knee so your foot is flat on the floor. Slowly slide your left / right heel back toward your buttocks. Stop when you feel a gentle stretch in the front of your knee or thigh (flexion). Hold this position for 10 seconds. Slowly slide your left / right heel back to the starting position. Repeat 10 times. Complete this exercise 3-4 times per week.      Quadriceps stretch, prone Lie on your abdomen on a firm surface, such as a bed or padded floor. Bend your left / right knee and hold your ankle. If you cannot reach your ankle or pant leg, loop a belt around your foot and grab the belt instead. Gently pull your heel toward your buttocks. Your knee should not slide out to the side. You should feel a stretch in the front of your thigh and knee (quadriceps). Hold this position for 10 seconds. Repeat 10 times. Complete this exercise 3-4 times per week.      Hamstring, supine Lie on your back (supine position). Loop a belt or towel over the ball of your left / right foot. The ball of your foot is on the walking surface,  right under your toes. Straighten your left / right knee and slowly pull on the belt to raise your leg until you feel a gentle stretch behind your knee (hamstring). Do not let your knee bend while you do this. Keep your other leg flat on the floor. Hold this position for 10 seconds. Repeat 10 times. Complete this exercise 3-4 times per week.   Strengthening exercises These exercises build strength and endurance in your knee. Endurance is the ability to use your muscles for a long time, even after they get tired.  Quadriceps, isometric This exercise stretches the  muscles in front of your thigh (quadriceps) without moving your knee joint (isometric). Lie on your back with your left / right leg extended and your other knee bent. Put a rolled towel or small pillow under your knee if told by your health care provider. Slowly tense the muscles in the front of your left / right thigh. You should see your kneecap slide up toward your hip or see increased dimpling just above the knee. This motion will push the back of the knee toward the floor. For 10 seconds, hold the muscle as tight as you can without increasing your pain. Relax the muscles slowly and completely. Repeat 10 times. Complete this exercise 3-4 times per week. .     Straight leg raises This exercise stretches the muscles in front of your thigh (quadriceps) and the muscles that move your hips (hip flexors). Lie on your back with your left / right leg extended and your other knee bent. Tense the muscles in the front of your left / right thigh. You should see your kneecap slide up or see increased dimpling just above the knee. Your thigh may even shake a bit. Keep these muscles tight as you raise your leg 4-6 inches (10-15 cm) off the floor. Do not let your knee bend. Hold this position for 10 seconds. Keep these muscles tense as you lower your leg. Relax your muscles slowly and completely after each repetition. Repeat 10 times. Complete this exercise 3-4 times per week.  Hamstring, isometric Lie on your back on a firm surface. Bend your left / right knee about 30 degrees. Dig your left / right heel into the surface as if you are trying to pull it toward your buttocks. Tighten the muscles in the back of your thighs (hamstring) to "dig" as hard as you can without increasing any pain. Hold this position for 10 seconds. Release the tension gradually and allow your muscles to relax completely for __________ seconds after each repetition. Repeat 10 times. Complete this exercise 3-4 times per  week.  Hamstring curls If told by your health care provider, do this exercise while wearing ankle weights. Begin with 5 lb weights. Then increase the weight by 1 lb (0.5 kg) increments. You can also use an exercise band Lie on your abdomen with your legs straight. Bend your left / right knee as far as you can without feeling pain. Keep your hips flat against the floor. Hold this position for 10 seconds. Slowly lower your leg to the starting position. Repeat 10 times. Complete this exercise 3-4 times per week.      Squats This exercise strengthens the muscles in front of your thigh and knee (quadriceps). Stand in front of a table, with your feet and knees pointing straight ahead. You may rest your hands on the table for balance but not for support. Slowly bend your knees and lower your hips like you  are going to sit in a chair. Keep your weight over your heels, not over your toes. Keep your lower legs upright so they are parallel with the table legs. Do not let your hips go lower than your knees. Do not bend lower than told by your health care provider. If your knee pain increases, do not bend as low. Hold the squat position for 10 seconds. Slowly push with your legs to return to standing. Do not use your hands to pull yourself to standing. Repeat 10 times. Complete this exercise 3-4 times per week .     Wall slides This exercise strengthens the muscles in front of your thigh and knee (quadriceps). Lean your back against a smooth wall or door, and walk your feet out 18-24 inches (46-61 cm) from it. Place your feet hip-width apart. Slowly slide down the wall or door until your knees bend 90 degrees. Keep your knees over your heels, not over your toes. Keep your knees in line with your hips. Hold this position for 10 seconds. Repeat 10 times. Complete this exercise 3-4 times per week.      Straight leg raises This exercise strengthens the muscles that rotate the leg at the hip and  move it away from your body (hip abductors). Lie on your side with your left / right leg in the top position. Lie so your head, shoulder, knee, and hip line up. You may bend your bottom knee to help you keep your balance. Roll your hips slightly forward so your hips are stacked directly over each other and your left / right knee is facing forward. Leading with your heel, lift your top leg 4-6 inches (10-15 cm). You should feel the muscles in your outer hip lifting. Do not let your foot drift forward. Do not let your knee roll toward the ceiling. Hold this position for 10 seconds. Slowly return your leg to the starting position. Let your muscles relax completely after each repetition. Repeat 10 times. Complete this exercise 3-4 times per week.      Straight leg raises This exercise stretches the muscles that move your hips away from the front of the pelvis (hip extensors). Lie on your abdomen on a firm surface. You can put a pillow under your hips if that is more comfortable. Tense the muscles in your buttocks and lift your left / right leg about 4-6 inches (10-15 cm). Keep your knee straight as you lift your leg. Hold this position for 10 seconds. Slowly lower your leg to the starting position. Let your leg relax completely after each repetition. Repeat 10 times. Complete this exercise 3-4 times per week.

## 2021-10-29 NOTE — Progress Notes (Signed)
New Patient Visit  Assessment: Ebony Shaw is a 66 y.o. female with the following: 1. Arthritis of left knee 2. Pes anserinus bursitis of left knee  Plan: Reviewed radiographs with the patient in clinic today, which demonstrates some mild to moderate degenerative changes overall.  There is some loss of joint space, as well as some subchondral sclerosis.  Minimal osteophytes.  She has tenderness to palpation over the medial joint line, as well as directly overlying the pes anserine tendons.  In addition, she has a moderate effusion of the left knee.  We discussed multiple treatment options, and she is interested in proceeding with an injection.  I recommended an aspiration of the knee, as well as an injection.  Have also recommended an injection of the pes anserine bursa.  We successfully aspirated approximately 60 cc of mostly clear joint fluid.  I am not concerned about an infection, but there were some particles within the aspirate.  As a result, we will send the joint fluid for analysis.  No history of gout.  Exercises were provided for the patient.  She will follow-up as needed.   Procedure note injection Left knee joint aspiration and injection   Verbal consent was obtained to inject the left knee joint  Timeout was completed to confirm the site of injection.  The skin was prepped with alcohol and ethyl chloride was sprayed at the aspiration site.  We introduced an 18-gauge needle into the superior lateral aspect of the left knee, and we are able to aspirate 60 cc of the clear joint fluid, with some particulate matter.  No blood.  No purulence was appreciated. Using the same needle, we injected 40 mg of Depo-Medrol and 1% lidocaine (3 cc) into the left knee There were no complications. A sterile bandage was applied.  Procedure note injection Left Pes anserine bursa   Verbal consent was obtained to inject the pes anserine bursa Timeout was completed to confirm the site of injection.  The  patient identified the point of maximal tenderness.  The skin was prepped with alcohol and ethyl chloride was sprayed at the injection site.  A 21-gauge needle was used to inject 40 mg of Depo-Medrol and 1% lidocaine (1 cc) into the bursa and surrounding soft tissue using a direct anterior approach.  There were no complications. A sterile bandage was applied.        Follow-up: Return if symptoms worsen or fail to improve.  Subjective:  Chief Complaint  Patient presents with   Knee Pain    Lt knee pain for a month but the last 2 days has been worse     History of Present Illness: Ebony Shaw is a 66 y.o. female who presents for evaluation of left knee pain.  She is having pain in the left knee for the past month.  She denies a specific injury.  Pain is worse in the medial aspect of the knee.  She is also notes that she has swelling in the left knee.  It feels tight when she is trying to bend her knee.  No history of injury to her left knee.  She been taken some medications as needed.  No physical therapy.  She has never had an injection.   Review of Systems: No fevers or chills No numbness or tingling No chest pain No shortness of breath No bowel or bladder dysfunction No GI distress No headaches   Medical History:  Past Medical History:  Diagnosis Date  CHF (congestive heart failure) (HCC)    Chronic headaches    "for as long as I can remember"   Coronary artery disease    Hypertension    Myocardial infarction [facility])     Past Surgical History:  Procedure Laterality Date   CARDIAC CATHETERIZATION     CARDIAC ELECTROPHYSIOLOGY STUDY AND ABLATION     TUBAL LIGATION      No family history on file. Social History   Tobacco Use   Smoking status: Every Day   Smokeless tobacco: Never  Substance Use Topics   Alcohol use: Yes   Drug use: No    Types: Marijuana    No Known Allergies  Current Meds  Medication Sig   losartan (COZAAR) 25 MG tablet 1 tablet    traZODone (DESYREL) 150 MG tablet 1 tablet at bedtime.    Objective: BP (!) 142/88    Pulse 70    Ht 5\' 11"  (1.803 m)    Wt 178 lb (80.7 kg)    BMI 24.83 kg/m   Physical Exam:  General: Alert and oriented. and No acute distress. Gait: Left sided antalgic gait.  Left knee with a moderate effusion.  No redness is appreciated.  The knee is not warm.  Arc of motion is approximately from 10-110 degrees.  She has pain in the posterior aspect of the knee, as well as the distal hamstrings with extension.  She is tenderness to palpation directly overlying the pes anserine bursa.  Tenderness to palpation along the medial joint line.  Increased pain with hyperflexion.  Negative Lachman.  No increased laxity varus or valgus stress.  No bruising is appreciated  IMAGING: I personally ordered and reviewed the following images  X-rays of the left knee were obtained in clinic today.  Neutral alignment overall.  Mild to moderate loss of joint space.  Small osteophytes are noted.  There is subchondral sclerosis within the medial compartment.  There is some lateral patellar tilt.  No acute injuries are noted.  Impression: Mild to moderate left knee arthrosis  New Medications:  No orders of the defined types were placed in this encounter.     Mordecai Rasmussen, MD  10/30/2021 9:04 AM

## 2021-10-30 ENCOUNTER — Encounter: Payer: Self-pay | Admitting: Orthopedic Surgery

## 2021-10-30 LAB — SYNOVIAL FLUID ANALYSIS, COMPLETE
Basophils, %: 0 %
Eosinophils-Synovial: 0 % (ref 0–2)
Lymphocytes-Synovial Fld: 9 % (ref 0–74)
Monocyte/Macrophage: 13 % (ref 0–69)
Neutrophil, Synovial: 78 % — ABNORMAL HIGH (ref 0–24)
Synoviocytes, %: 0 % (ref 0–15)
WBC, Synovial: 21940 cells/uL — ABNORMAL HIGH (ref <150)

## 2022-01-26 ENCOUNTER — Emergency Department (HOSPITAL_COMMUNITY): Payer: Medicare HMO

## 2022-01-26 ENCOUNTER — Ambulatory Visit
Admission: EM | Admit: 2022-01-26 | Discharge: 2022-01-26 | Disposition: A | Discharge status: Home / Self Care | Payer: Medicare HMO

## 2022-01-26 ENCOUNTER — Encounter (HOSPITAL_COMMUNITY): Payer: Self-pay

## 2022-01-26 ENCOUNTER — Emergency Department (HOSPITAL_COMMUNITY)
Admission: EM | Admit: 2022-01-26 | Discharge: 2022-01-26 | Disposition: A | Discharge status: Home / Self Care | Payer: Medicare HMO | Attending: Emergency Medicine | Admitting: Emergency Medicine

## 2022-01-26 ENCOUNTER — Other Ambulatory Visit: Payer: Self-pay

## 2022-01-26 DIAGNOSIS — R1110 Vomiting, unspecified: Secondary | ICD-10-CM | POA: Diagnosis not present

## 2022-01-26 DIAGNOSIS — S0990XA Unspecified injury of head, initial encounter: Secondary | ICD-10-CM | POA: Diagnosis not present

## 2022-01-26 DIAGNOSIS — F0781 Postconcussional syndrome: Secondary | ICD-10-CM | POA: Diagnosis not present

## 2022-01-26 DIAGNOSIS — I2510 Atherosclerotic heart disease of native coronary artery without angina pectoris: Secondary | ICD-10-CM | POA: Insufficient documentation

## 2022-01-26 DIAGNOSIS — Z7982 Long term (current) use of aspirin: Secondary | ICD-10-CM | POA: Diagnosis not present

## 2022-01-26 DIAGNOSIS — Z79899 Other long term (current) drug therapy: Secondary | ICD-10-CM | POA: Diagnosis not present

## 2022-01-26 DIAGNOSIS — R519 Headache, unspecified: Secondary | ICD-10-CM | POA: Insufficient documentation

## 2022-01-26 DIAGNOSIS — R112 Nausea with vomiting, unspecified: Secondary | ICD-10-CM | POA: Diagnosis not present

## 2022-01-26 DIAGNOSIS — S060X9A Concussion with loss of consciousness of unspecified duration, initial encounter: Secondary | ICD-10-CM | POA: Diagnosis not present

## 2022-01-26 DIAGNOSIS — I509 Heart failure, unspecified: Secondary | ICD-10-CM | POA: Insufficient documentation

## 2022-01-26 DIAGNOSIS — I110 Hypertensive heart disease with heart failure: Secondary | ICD-10-CM | POA: Diagnosis not present

## 2022-01-26 MED ORDER — ONDANSETRON 4 MG PO TBDP
4.0000 mg | ORAL_TABLET | Freq: Once | ORAL | Status: AC
  Administered 2022-01-26: 4 mg via ORAL

## 2022-01-26 MED ORDER — ONDANSETRON HCL 4 MG PO TABS: 4.0000 mg | ORAL_TABLET | Freq: Four times a day (QID) | ORAL | 0 refills | Status: DC

## 2022-01-26 NOTE — ED Provider Notes (Signed)
[facility] EMERGENCY DEPARTMENT Provider Note   CSN: [id number] Arrival date & time: 01/26/22  1405     History  Chief Complaint  Patient presents with   Headache    Ebony Shaw is a 66 y.o. female.   Headache Associated symptoms: nausea and vomiting   Associated symptoms: no abdominal pain, no back pain, no dizziness, no fever, no neck pain and no weakness        Ebony Shaw is a 66 y.o. female with past medical history significant for coronary artery disease, chronic headaches, hypertension and CHF who presents to the Emergency Department complaining of persistent headache x2 weeks.  She states that she struck the top of her head on a car latch 2 weeks ago.  She reports having severe pain to the top of her head and "saw stars" denies loss of consciousness.  States that she has had intermittent episodes of nausea and vomiting with persistent headache since the incident occurred.  Headache has been associated with photophobia.  Vomiting became more frequent this morning.  She went to urgent care and was advised to come to the hospital for CT scan of her head.  She has been taking Goody powders without relief.  She denies any extremity pain or weakness, neck pain, dizziness or chest pain.    Home Medications Prior to Admission medications   Medication Sig Start Date End Date Taking? Authorizing Provider  Aspirin-Salicylamide-Caffeine (BC HEADACHE POWDER PO) Take by mouth.    [provider]  furosemide (LASIX) 20 MG tablet 1 tablet    [provider]  losartan (COZAAR) 25 MG tablet 1 tablet 12/19/19   [provider]  spironolactone (ALDACTONE) 25 MG tablet 1 tablet    [provider]  traZODone (DESYREL) 150 MG tablet 1 tablet at bedtime. 03/03/18   [provider]      Allergies    Patient has no known allergies.    Review of Systems   Review of Systems  Constitutional:  Negative for appetite change and fever.  HENT:   Negative for trouble swallowing.   Eyes:  Positive for visual disturbance.  Respiratory:  Negative for shortness of breath.   Cardiovascular:  Negative for chest pain.  Gastrointestinal:  Positive for nausea and vomiting. Negative for abdominal pain.  Genitourinary:  Negative for dysuria.  Musculoskeletal:  Negative for arthralgias, back pain and neck pain.  Skin:  Negative for color change and wound.  Neurological:  Positive for headaches. Negative for dizziness, syncope, weakness and light-headedness.  Psychiatric/Behavioral:  Negative for confusion.    Physical Exam Updated Vital Signs BP (!) 167/92 (BP Location: Left Arm)   Pulse (!) 58   Temp 98.3 F (36.8 C) (Oral)   Resp 20   Ht 6' (1.829 m)   Wt 78.9 kg   SpO2 100%   BMI 23.60 kg/m  Physical Exam Vitals and nursing note reviewed.  Constitutional:      General: She is not in acute distress.    Appearance: She is well-developed. She is not ill-appearing or toxic-appearing.  HENT:     Head:     Comments: Focal tenderness to mid scalp region.  No abrasion, laceration or hematoma.    Mouth/Throat:     Mouth: Mucous membranes are moist.  Eyes:     Extraocular Movements: Extraocular movements intact.     Pupils: Pupils are equal, round, and reactive to light.  Neck:     Meningeal: Kernig's sign absent.  Cardiovascular:     Rate and Rhythm: Normal rate and regular rhythm.     Pulses: Normal pulses.     Heart sounds: Normal heart sounds.  Pulmonary:     Effort: Pulmonary effort is normal.     Breath sounds: Normal breath sounds.  Abdominal:     Palpations: Abdomen is soft.  Musculoskeletal:        General: Normal range of motion.     Cervical back: Full passive range of motion without pain and normal range of motion. No spinous process tenderness or muscular tenderness.  Skin:    General: Skin is warm.     Capillary Refill: Capillary refill takes less than 2 seconds.  Neurological:     General: No focal deficit  present.     Mental Status: She is alert.     GCS: GCS eye subscore is 4. GCS verbal subscore is 5. GCS motor subscore is 6.     Sensory: Sensation is intact. No sensory deficit.     Motor: Motor function is intact. No weakness.     Coordination: Coordination is intact.     Comments: CN II through XII grossly intact.  Speech clear.    ED Results / Procedures / Treatments   Labs (all labs ordered are listed, but only abnormal results are displayed) Labs Reviewed - No data to display  EKG None  Radiology CT Head Wo Contrast  Result Date: 01/26/2022 CLINICAL DATA:  Head trauma, moderate-severe EXAM: CT HEAD WITHOUT CONTRAST TECHNIQUE: Contiguous axial images were obtained from the base of the skull through the vertex without intravenous contrast. RADIATION DOSE REDUCTION: This exam was performed according to the departmental dose-optimization program which includes automated exposure control, adjustment of the mA and/or kV according to patient size and/or use of iterative reconstruction technique. COMPARISON:  2017 FINDINGS: Brain: There is no acute intracranial hemorrhage, mass effect, or edema. Gray-white differentiation is preserved. There is no extra-axial fluid collection. Prominence of the ventricles and sulci reflects minor parenchymal volume loss. Patchy hypoattenuation in the supratentorial white matter is nonspecific but may reflect mild chronic microvascular ischemic changes. These findings are similar to the prior study. Vascular: There is atherosclerotic calcification at the skull base. Skull: Calvarium is unremarkable. Sinuses/Orbits: No acute finding. Other: None. IMPRESSION: No evidence of acute intracranial injury. Electronically Signed   By: Guadlupe Spanish M.D.   On: 01/26/2022 15:39     Procedures Procedures    Medications Ordered in ED Medications - No data to display  ED Course/ Medical Decision Making/ A&P                           Medical Decision Making Patient  here for evaluation of headache after trauma.  Struck her head on a car latch 2 weeks ago.  No LOC.  No focal neurodeficits on her exam.  Having persistent headaches since the incident occurred with intermittent nausea and vomiting.  Vomiting has been more frequent today.  Amount and/or Complexity of Data Reviewed External Data Reviewed: notes.    Details: External records reviewed Radiology: ordered.    Details: CT head Discussion of management or test interpretation with external provider(s): Patient here with headaches after head injury.  No loss of consciousness.  Injury occurred 2 weeks ago.  Differential includes subdural hematoma postconcussive syndrome.  I suspect her recurrent headaches are associated with postconcussive syndrome.  Her CT scan without evidence of acute process.  Risk Prescription  drug management.  Discussed findings with patient.  He appears appropriate for discharge.  She agrees to follow-up with PCP for recheck.  Return precautions discussed.         Final Clinical Impression(s) / ED Diagnoses Final diagnoses:  Postconcussive syndrome    Rx / DC Orders ED Discharge Orders     None         Pauline Aus, PA-C 01/29/22 1556    Gloris Manchester, MD 01/31/22 1725

## 2022-01-26 NOTE — ED Notes (Signed)
[facility] called for transport 

## 2022-01-26 NOTE — ED Triage Notes (Addendum)
Patient with complaints of headache after hitting head on a car hood latch 2 weeks prior that has not went away and nausea and vomiting that started this morning. She was just seen at urgent care and told to come to the ED for a CT scan.

## 2022-01-26 NOTE — Discharge Instructions (Addendum)
Due to your new symptom of nausea and vomiting, weakness, and continued headache, recommend that you go to the ER for further evaluation.  Transported via EMS due to persistent nausea and vomiting.

## 2022-01-26 NOTE — ED Notes (Signed)
Patient refusing EMS transport.  States she called her husband, and he is coming to get her.  EMS notified.

## 2022-01-26 NOTE — ED Triage Notes (Signed)
Pt reports headache x 2 weeks after she hit the head with a truck latch. States she vomited 3-4 times today. BC powder gives no relief.

## 2022-01-26 NOTE — ED Notes (Signed)
Patient's husband here to transport patient to ED.

## 2022-01-26 NOTE — Discharge Instructions (Signed)
Your CT scan today was reassuring.  You likely are having recurrent headaches from your head injury.  This can persist for weeks.  I recommend that you take over-the-counter Tylenol for pain.  You have been prescribed medication for the nausea vomiting if needed.  Follow-up with your primary care provider for recheck.  Return emergency department for any new or worsening symptoms.

## 2022-01-26 NOTE — ED Provider Notes (Signed)
[facility]    CSN: [id number] Arrival date & time: 01/26/22  1122      History   Chief Complaint Chief Complaint  Patient presents with   Headache    HPI Ebony Shaw is a 66 y.o. female.   The patient is a 66 year old female who presents after head injury.  Her head injury occurred approximately 2 weeks ago after she hit her head on a truck latch.  Since that time she has had a continuous headache.  Today, she reports for the continued headache, and she also is now having nausea and vomiting.  She has vomited at least 3-4 times today.  Patient also reports increased fatigue since she hit her head.  She denies being on any kind of blood thinners.  She denies blurred vision, change in vision, or loss of vision.  She does state that she does also feel dizzy, and uncoordinated at times.  The headache is in the mid parietal region of her head.  She has been taking BC powder with no relief.  The history is provided by the patient.  Headache Associated symptoms: dizziness and fatigue    Past Medical History:  Diagnosis Date   CHF (congestive heart failure) (HCC)    Chronic headaches    "for as long as I can remember"   Coronary artery disease    Hypertension    Myocardial infarction (HCC)     There are no problems to display for this patient.   Past Surgical History:  Procedure Laterality Date   CARDIAC CATHETERIZATION     CARDIAC ELECTROPHYSIOLOGY STUDY AND ABLATION     TUBAL LIGATION      OB History   No obstetric history on file.      Home Medications    Prior to Admission medications   Medication Sig Start Date End Date Taking? Authorizing Provider  Aspirin-Salicylamide-Caffeine (BC HEADACHE POWDER PO) Take by mouth.   Yes [provider]  furosemide (LASIX) 20 MG tablet 1 tablet    [provider]  losartan (COZAAR) 25 MG tablet 1 tablet 12/19/19   [provider]  spironolactone (ALDACTONE) 25 MG tablet 1 tablet     [provider]  traZODone (DESYREL) 150 MG tablet 1 tablet at bedtime. 03/03/18   [provider]    Family History History reviewed. No pertinent family history.  Social History Social History   Tobacco Use   Smoking status: Every Day   Smokeless tobacco: Never  Substance Use Topics   Alcohol use: Yes   Drug use: No    Types: Marijuana     Allergies   Patient has no known allergies.   Review of Systems Review of Systems  Constitutional:  Positive for fatigue.  Respiratory: Negative.    Cardiovascular: Negative.   Gastrointestinal: Negative.   Skin: Negative.   Neurological:  Positive for dizziness, light-headedness and headaches.  Psychiatric/Behavioral: Negative.      Physical Exam Triage Vital Signs ED Triage Vitals  Enc Vitals Group     BP 01/26/22 1245 (!) 167/89     Pulse Rate 01/26/22 1245 61     Resp 01/26/22 1245 18     Temp 01/26/22 1245 98 F (36.7 C)     Temp Source 01/26/22 1245 Oral     SpO2 01/26/22 1245 97 %     Weight --      Height --      Head Circumference --  Peak Flow --      Pain Score 01/26/22 1244 10     Pain Loc --      Pain Edu? --      Excl. in GC? --    No data found.  Updated Vital Signs BP (!) 167/89 (BP Location: Right Arm)   Pulse 61   Temp 98 F (36.7 C) (Oral)   Resp 18   SpO2 97%   Visual Acuity Right Eye Distance:   Left Eye Distance:   Bilateral Distance:    Right Eye Near:   Left Eye Near:    Bilateral Near:     Physical Exam Vitals and nursing note reviewed.  Constitutional:      General: She is not in acute distress.    Appearance: She is well-developed.  HENT:     Head: Normocephalic.  Eyes:     General: No visual field deficit.    Extraocular Movements: Extraocular movements intact.     Pupils: Pupils are equal, round, and reactive to light.  Cardiovascular:     Rate and Rhythm: Normal rate and regular rhythm.     Heart sounds: Normal heart sounds.  Pulmonary:      Effort: Pulmonary effort is normal.     Breath sounds: Normal breath sounds.  Abdominal:     General: Bowel sounds are normal.     Palpations: Abdomen is soft.  Musculoskeletal:     Cervical back: Normal range of motion and neck supple.  Skin:    General: Skin is warm and dry.     Capillary Refill: Capillary refill takes less than 2 seconds.     Comments: Tenderness noted to mid-parietal region of head, no swelling or induration noted.  Neurological:     Mental Status: She is alert and oriented to person, place, and time.     GCS: GCS eye subscore is 4. GCS verbal subscore is 5. GCS motor subscore is 6.     Cranial Nerves: No cranial nerve deficit or facial asymmetry.     Sensory: No sensory deficit.     Motor: No weakness or pronator drift.     Coordination: Coordination abnormal. Heel to Shin Test abnormal. Finger-Nose-Finger Test normal.     Gait: Gait normal.     UC Treatments / Results  Labs (all labs ordered are listed, but only abnormal results are displayed) Labs Reviewed - No data to display  EKG   Radiology No results found.  Procedures Procedures (including critical care time)  Medications Ordered in UC Medications  ondansetron (ZOFRAN-ODT) disintegrating tablet 4 mg (4 mg Oral Given 01/26/22 1301)    Initial Impression / Assessment and Plan / UC Course  I have reviewed the triage vital signs and the nursing notes.  Pertinent labs & imaging results that were available during my care of the patient were reviewed by me and considered in my medical decision making (see chart for details).  The patient is a 66 year old female who presents for complaints of headache with nausea and vomiting after hitting her head on a truck latch approximately 2 weeks ago.  States headache has been present since that time.  She denies loss of consciousness.  She continues to complain of pain in the parietal region of her head.  She denies any swelling to that area after the injury.   Today, she developed nausea and vomiting.  She vomited 3-4 times this morning, and has vomited twice while in the clinic.  Patient was given  antiemetics for her symptoms.  Patient's neurological exam did show difficulty with heel-to-toe walking and she did display abnormal coordination.  She was able to perform heel-to-shin, but had to have support when doing so.  There is no tremor noted on her exam.  Patient was advised that resources in urgent care are limited.  Patient states that she would like to go to the ER for further evaluation.  Patient has persist  EMS was called, patient refused transport. Patient has called her husband to take to the the ER. Final Clinical Impressions(s) / UC Diagnoses   Final diagnoses:  Closed head injury, initial encounter  Concussion with loss of consciousness, initial encounter  Nausea and vomiting, unspecified vomiting type     Discharge Instructions      Due to your new symptom of nausea and vomiting, weakness, and continued headache, recommend that you go to the ER for further evaluation.  Transported via EMS due to persistent nausea and vomiting.     ED Prescriptions   None    PDMP not reviewed this encounter.   Abran Cantor, NP 01/26/22 [phone]

## 2022-05-20 ENCOUNTER — Telehealth: Payer: Self-pay | Admitting: Orthopedic Surgery

## 2022-05-20 NOTE — Telephone Encounter (Signed)
Message received from patient regarding schedule of appointment with Dr Dallas Schimke. Call returned; reached voice mail, left message.

## 2022-05-22 ENCOUNTER — Telehealth: Payer: Self-pay | Admitting: Orthopedic Surgery

## 2022-05-22 NOTE — Telephone Encounter (Signed)
Patient called; states she is returning missed call from [place] to her message 05/20/22. Patient states she had a fall, and went to [facility] Emergency room; states right upper body, "around her angel wings" was hurting the most at the time. States had Xrays and CT scan. Discussed scheduling; aware to request provider notes from the visit, Xray and CT reports, and CD. Patient to call today to request and to call us back to schedule appointment with  Dr Dallas Schimke.

## 2022-05-27 ENCOUNTER — Encounter: Payer: Self-pay | Admitting: Orthopedic Surgery

## 2022-05-27 ENCOUNTER — Ambulatory Visit (INDEPENDENT_AMBULATORY_CARE_PROVIDER_SITE_OTHER): Payer: Medicare HMO | Admitting: Orthopedic Surgery

## 2022-05-27 DIAGNOSIS — M5412 Radiculopathy, cervical region: Secondary | ICD-10-CM

## 2022-05-27 DIAGNOSIS — M79601 Pain in right arm: Secondary | ICD-10-CM

## 2022-05-27 MED ORDER — PREDNISONE 10 MG (21) PO TBPK: ORAL_TABLET | ORAL | 0 refills | Status: DC

## 2022-05-27 MED ORDER — CYCLOBENZAPRINE HCL 10 MG PO TABS: 10.0000 mg | ORAL_TABLET | Freq: Two times a day (BID) | ORAL | 0 refills | Status: DC | PRN

## 2022-05-27 NOTE — Progress Notes (Signed)
Orthopaedic Clinic Return  Assessment: LULUBELLE DILLAVOU is a 66 y.o. female with the following: Right arm pain, likely cervical radiculopathy  Plan: Mrs. Hofman fell approximately 1 week ago.  She is complaining of pain that starts around her right elbow, and radiates distally into her hand.  This is associated with numbness and tingling.  The pain has progressed from including her entire arm, and is now just her distal arm.  Based on review of the cervical CT scan, as well as her right shoulder x-rays, I think there is some irritation of the nerves into her right arm.  As such, I recommending a prednisone Dosepak, and Flexeril.  I will see her back in 2 weeks for repeat evaluation.  Depending on her improvements at that time, we may have to consider further evaluation and/or referral to a neurosurgeon.  Meds ordered this encounter  Medications   predniSONE (STERAPRED UNI-PAK 21 TAB) 10 MG (21) TBPK tablet    Sig: 10 mg DS 12 as directed    Dispense:  48 tablet    Refill:  0   cyclobenzaprine (FLEXERIL) 10 MG tablet    Sig: Take 1 tablet (10 mg total) by mouth 2 (two) times daily as needed for muscle spasms.    Dispense:  20 tablet    Refill:  0      Follow-up: Return in about 2 weeks (around 06/10/2022).   Subjective:  Chief Complaint  Patient presents with   Arm Pain    Pt with right arm pain after a fall 1 wk ago, Had xrays of neck and shoulder but is c/o pain from elbow to fingers now.     History of Present Illness: NICEY KECK is a 66 y.o. female who returns to clinic for evaluation of right arm pain.  I previously seen her for knee pain, and she is doing well.  Approximately 1 week ago, she sustained a mechanical fall.  She states she hit her head on the wall, and felt as though she broke her arm.  She presented to an emergency department, where x-rays of the right shoulder were negative, and a CT scan of the cervical spine demonstrated degenerative changes.  Initially, she  had pain radiating into the right shoulder.  This is progressed.  The right shoulder no longer bothers her.  She is complaining of pain starting around her right elbow, extending distally into her hand.  She has associated numbness and tingling.  She has been taking ibuprofen and methocarbamol, with limited improvement in her symptoms.  She states the pain is relentless.  Review of Systems: No fevers or chills + numbness and tingling No chest pain No shortness of breath No bowel or bladder dysfunction No GI distress No headaches   Objective: There were no vitals taken for this visit.  Physical Exam:  Alert and oriented.  No acute distress.  Evaluation of the right arm demonstrates no bruising.  No swelling.  She has no point tenderness.  She has full and painless range of motion of the right shoulder.  Good strength in her shoulder.  Grip strength is good, but she reports pain in the right hand.  Slightly decreased sensation to the entire right hand.  Restricted range of motion of the cervical spine.  In particular, she has pain in the upper back with flexion.  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  Report indicates low-grade anterolisthesis at C3-4.  Loss of normal curvature.  Right shoulder x-ray is negative for acute injury.   Mordecai Rasmussen, MD 05/27/2022 9:39 AM

## 2022-06-05 ENCOUNTER — Telehealth: Payer: Self-pay | Admitting: Orthopedic Surgery

## 2022-06-05 NOTE — Telephone Encounter (Signed)
Patient called to ask if something may be prescribed for her pain in right arm; aware of her follow up appointment scheduled with Dr Amedeo Kinsman Tuesday, 06/09/22. States if any prescription given, uses [facility].

## 2022-06-09 ENCOUNTER — Encounter: Payer: Self-pay | Admitting: Orthopedic Surgery

## 2022-06-09 ENCOUNTER — Ambulatory Visit: Payer: Medicare HMO | Admitting: Orthopedic Surgery

## 2022-06-09 VITALS — BP 186/103 | HR 84 | Ht 71.0 in | Wt 170.0 lb

## 2022-06-09 DIAGNOSIS — G4700 Insomnia, unspecified: Secondary | ICD-10-CM | POA: Insufficient documentation

## 2022-06-09 DIAGNOSIS — E785 Hyperlipidemia, unspecified: Secondary | ICD-10-CM | POA: Insufficient documentation

## 2022-06-09 DIAGNOSIS — I499 Cardiac arrhythmia, unspecified: Secondary | ICD-10-CM | POA: Insufficient documentation

## 2022-06-09 DIAGNOSIS — M79601 Pain in right arm: Secondary | ICD-10-CM | POA: Diagnosis not present

## 2022-06-09 DIAGNOSIS — R609 Edema, unspecified: Secondary | ICD-10-CM | POA: Insufficient documentation

## 2022-06-09 DIAGNOSIS — F17200 Nicotine dependence, unspecified, uncomplicated: Secondary | ICD-10-CM | POA: Insufficient documentation

## 2022-06-09 DIAGNOSIS — R600 Localized edema: Secondary | ICD-10-CM | POA: Insufficient documentation

## 2022-06-09 DIAGNOSIS — M5412 Radiculopathy, cervical region: Secondary | ICD-10-CM | POA: Diagnosis not present

## 2022-06-09 DIAGNOSIS — I252 Old myocardial infarction: Secondary | ICD-10-CM | POA: Insufficient documentation

## 2022-06-09 DIAGNOSIS — I471 Supraventricular tachycardia, unspecified: Secondary | ICD-10-CM | POA: Insufficient documentation

## 2022-06-09 DIAGNOSIS — I219 Acute myocardial infarction, unspecified: Secondary | ICD-10-CM | POA: Insufficient documentation

## 2022-06-09 MED ORDER — CYCLOBENZAPRINE HCL 10 MG PO TABS
10.0000 mg | ORAL_TABLET | Freq: Two times a day (BID) | ORAL | 0 refills | Status: DC | PRN
Start: 2022-06-09 — End: 2022-12-18

## 2022-06-09 MED ORDER — GABAPENTIN 100 MG PO CAPS: 100.0000 mg | ORAL_CAPSULE | Freq: Three times a day (TID) | ORAL | 0 refills | Status: DC

## 2022-06-09 NOTE — Telephone Encounter (Signed)
Will wait until appointment.

## 2022-06-09 NOTE — Progress Notes (Signed)
Orthopaedic Clinic Return  Assessment: Ebony Shaw is a 66 y.o. female with the following: Right arm pain cervical radiculopathy  Plan: Mrs. Catala has improved pain, and function of the right arm, but continues to have residual symptoms.  We discussed potential treatment options, and she is interested in trying gabapentin.  This was prescribed in clinic today, as well as a refill of Flexeril.  We will plan to see her back in 1 month, and that she has further issues before then.   Meds ordered this encounter  Medications   gabapentin (NEURONTIN) 100 MG capsule    Sig: Take 1 capsule (100 mg total) by mouth 3 (three) times daily.    Dispense:  90 capsule    Refill:  0   cyclobenzaprine (FLEXERIL) 10 MG tablet    Sig: Take 1 tablet (10 mg total) by mouth 2 (two) times daily as needed for muscle spasms.    Dispense:  20 tablet    Refill:  0      Follow-up: Return in about 4 weeks (around 07/07/2022).   Subjective:  Chief Complaint  Patient presents with   Arm Pain    Neck into right arm still painful states needs something for the nerve pain, not better with the prednisone and flexeril     History of Present Illness: Ebony Shaw is a 66 y.o. female who returns to clinic for evaluation of right arm pain.  I saw her 2 weeks ago, after a fall on an outstretched right arm.  She was having severe right arm pain, radiating distally into her hand.  She has taken prednisone, and states it has not helped.  However, she does feel more comfortable.  She still has occasional sharp pains.  She has some burning, numbness and tingling in the right hand as well.    Review of Systems: No fevers or chills + numbness and tingling No chest pain No shortness of breath No bowel or bladder dysfunction No GI distress No headaches   Objective: BP (!) 186/103   Pulse 84   Ht 5\' 11"  (1.803 m)   Wt 170 lb (77.1 kg)   BMI 23.71 kg/m   Physical Exam:  Alert and oriented.  No acute  distress.  Evaluation of the right arm demonstrates no bruising.  No swelling.  She has no point tenderness.  She has full and painless range of motion of the right shoulder.  Good strength in her shoulder.  Grip strength is good, but she reports pain in the right hand.  Slightly decreased sensation to the entire right hand.  Restricted range of motion of the cervical spine.  In particular, she has pain in the upper back with flexion.  IMAGING: I personally ordered and reviewed the following images:  No new imaging obtained today   Mordecai Rasmussen, MD 06/09/2022 8:52 AM

## 2022-07-07 ENCOUNTER — Encounter: Payer: Self-pay | Admitting: Orthopedic Surgery

## 2022-07-07 ENCOUNTER — Ambulatory Visit: Payer: Medicare HMO | Admitting: Orthopedic Surgery

## 2022-07-07 VITALS — Ht 71.0 in | Wt 170.0 lb

## 2022-07-07 DIAGNOSIS — M5412 Radiculopathy, cervical region: Secondary | ICD-10-CM

## 2022-07-07 DIAGNOSIS — M79601 Pain in right arm: Secondary | ICD-10-CM

## 2022-07-07 MED ORDER — GABAPENTIN 100 MG PO CAPS: 100.0000 mg | ORAL_CAPSULE | Freq: Three times a day (TID) | ORAL | 0 refills | Status: AC

## 2022-07-07 MED ORDER — NAPROXEN 500 MG PO TABS: 500.0000 mg | ORAL_TABLET | Freq: Two times a day (BID) | ORAL | 0 refills | Status: AC

## 2022-07-07 NOTE — Progress Notes (Signed)
Orthopaedic Clinic Return  Assessment: Ebony Shaw is a 66 y.o. female with the following: Right arm pain cervical radiculopathy  Plan: Ebony Shaw is getting better.  She feels as though she is getting better on a daily basis.  She is not taking medications on a daily basis at this point.  She would like to continue with the medicines.  I provided her with a home exercise program for her neck, as well as her shoulder.  If she has any further issues she will contact the clinic.  We briefly discussed formal physical therapy, and the possibility of proceeding with an MRI.  She states her understanding.  She will contact clinic if she needs something in the future.   Meds ordered this encounter  Medications   gabapentin (NEURONTIN) 100 MG capsule    Sig: Take 1 capsule (100 mg total) by mouth 3 (three) times daily.    Dispense:  90 capsule    Refill:  0   naproxen (NAPROSYN) 500 MG tablet    Sig: Take 1 tablet (500 mg total) by mouth 2 (two) times daily with a meal.    Dispense:  60 tablet    Refill:  0      Follow-up: Return if symptoms worsen or fail to improve.   Subjective:  Chief Complaint  Patient presents with   Arm Pain    Pain down right arm is better since taking gabapentin and naprosyn but still has occasional flare ups.     History of Present Illness: Ebony Shaw is a 66 y.o. female who returns to clinic for evaluation of right arm pain.  I saw her 6 weeks ago, after a fall on an outstretched right arm.  She was having severe right arm pain.  This has considerably improved.  She states that she is getting better daily.  She is no longer taking the medicines on a daily basis.   Review of Systems: No fevers or chills + numbness and tingling No chest pain No shortness of breath No bowel or bladder dysfunction No GI distress No headaches   Objective: Ht 5\' 11"  (1.803 m)   Wt 170 lb (77.1 kg)   BMI 23.71 kg/m   Physical Exam:  Alert and oriented.   No acute distress.  Evaluation of the right arm demonstrates no bruising.  No swelling.  She has no point tenderness.  She has full and painless range of motion of the right shoulder.  Good strength in her shoulder.  Grip strength.  Slightly decreased sensation to the tips of her fingers.  Restricted range of motion of the cervical spine.  In particular, she has pain in the upper back with flexion.  Well-healed carpal tunnel incision.  Negative Tinel's.  Negative carpal tunnel compression.  IMAGING: I personally ordered and reviewed the following images:  No new imaging obtained today   Mordecai Rasmussen, MD 07/07/2022 9:17 AM

## 2022-07-07 NOTE — Patient Instructions (Signed)
 Cervical Strain and Sprain Rehab You have pain and stiffness in your neck.  The muscles around your neck are irritated.  Recommend using heat (heating pad, or hot water in the shower) to warm up the affected muscles.  Then proceed with stretching and strengthening.  Do not stretch until it hurts, but you should feel a pull.  With each exercise, you should be able to stretch a little bit further.  Attempting these exercises daily, or on a regular basis, can improve your symptoms over time.  Do not expect immediate, sustained improvement.  But, it will make your symptoms better over time.   Ask your health care provider which exercises are safe for you. Do exercises exactly as told by your health care provider and adjust them as directed. It is normal to feel mild stretching, pulling, tightness, or discomfort as you do these exercises. Stop right away if you feel sudden pain or your pain gets worse. Do not begin these exercises until told by your health care provider. Stretching and range-of-motion exercises Cervical side bending  Using good posture, sit on a stable chair or stand up. Without moving your shoulders, slowly tilt your left / right ear to your shoulder until you feel a stretch in the opposite side neck muscles. You should be looking straight ahead. Hold for 10 seconds. Repeat with the other side of your neck. Repeat 10 times. Complete this exercise 1-2 times a day. Cervical rotation  Using good posture, sit on a stable chair or stand up. Slowly turn your head to the side as if you are looking over your left / right shoulder. Keep your eyes level with the ground. Stop when you feel a stretch along the side and the back of your neck. Hold for 10 seconds. Repeat this by turning to your other side. Repeat 10 times. Complete this exercise 1-2 times a day. Thoracic extension and pectoral stretch Roll a towel or a small blanket so it is about 4 inches (10 cm) in diameter. Lie down on  your back on a firm surface. Put the towel lengthwise, under your spine in the middle of your back. It should not be under your shoulder blades. The towel should line up with your spine from your middle back to your lower back. Put your hands behind your head and let your elbows fall out to your sides. Hold for 10 seconds. Repeat 10 times. Complete this exercise 1-2 times a day. Strengthening exercises Isometric upper cervical flexion Lie on your back with a thin pillow behind your head and a small rolled-up towel under your neck. Gently tuck your chin toward your chest and nod your head down to look toward your feet. Do not lift your head off the pillow. Hold for 10 seconds. Release the tension slowly. Relax your neck muscles completely before you repeat this exercise. Repeat 10 times. Complete this exercise 1-2 times a day. Isometric cervical extension  Stand about 6 inches (15 cm) away from a wall, with your back facing the wall. Place a soft object, about 6-8 inches (15-20 cm) in diameter, between the back of your head and the wall. A soft object could be a small pillow, a ball, or a folded towel. Gently tilt your head back and press into the soft object. Keep your jaw and forehead relaxed. Hold for 10 seconds. Release the tension slowly. Relax your neck muscles completely before you repeat this exercise. Repeat 10 times. Complete this exercise 1-2 times a day. Posture   and body mechanics Body mechanics refers to the movements and positions of your body while you do your daily activities. Posture is part of body mechanics. Good posture and healthy body mechanics can help to relieve stress in your body's tissues and joints. Good posture means that your spine is in its natural S-curve position (your spine is neutral), your shoulders are pulled back slightly, and your head is not tipped forward. The following are general guidelines for applying improved posture and body mechanics to your  everyday activities. Sitting  When sitting, keep your spine neutral and keep your feet flat on the floor. Use a footrest, if necessary, and keep your thighs parallel to the floor. Avoid rounding your shoulders, and avoid tilting your head forward. When working at a desk or a computer, keep your desk at a height where your hands are slightly lower than your elbows. Slide your chair under your desk so you are close enough to maintain good posture. When working at a computer, place your monitor at a height where you are looking straight ahead and you do not have to tilt your head forward or downward to look at the screen. Standing  When standing, keep your spine neutral and keep your feet about hip-width apart. Keep a slight bend in your knees. Your ears, shoulders, and hips should line up. When you do a task in which you stand in one place for a long time, place one foot up on a stable object that is 2-4 inches (5-10 cm) high, such as a footstool. This helps keep your spine neutral. Resting When lying down and resting, avoid positions that are most painful for you. Try to support your neck in a neutral position. You can use a contour pillow or a small rolled-up towel. Your pillow should support your neck but not push on it. This information is not intended to replace advice given to you by your health care provider. Make sure you discuss any questions you have with your health care provider. Document Revised: 12/14/2018 Document Reviewed: 05/25/2018 Elsevier Patient Education  2022 [facility] Tear/Tend[facility]   Ask your health care provider which exercises are safe for you. Do exercises exactly as told by your health care provider and adjust them as directed. It is normal to feel mild stretching, pulling, tightness, or discomfort as you do these exercises. Stop right away if you feel sudden pain or your pain gets worse. Do not begin these exercises until told by your health  care provider. Stretching and range-of-motion exercises  These exercises warm up your muscles and joints and improve the movement and flexibility of your shoulder. These exercises also help to relieve pain.  Shoulder pendulum In this exercise, you let the injured arm dangle toward the floor and then swing it like a clock pendulum. Stand near a table or counter that you can hold onto for balance. Bend forward at the waist and let your left / right arm hang straight down. Use your other arm to support you and help you stay balanced. Relax your left / right arm and shoulder muscles, and move your hips and your trunk so your left / right arm swings freely. Your arm should swing because of the motion of your body, not because you are using your arm or shoulder muscles. Keep moving your hips and trunk so your arm swings in the following directions, as told by your health care provider: Side to side. Forward and backward. In  clockwise and counterclockwise circles. Slowly return to the starting position. Repeat 10 times, or for 10 seconds per direction. Complete this exercise 2-3 times a day.      Shoulder flexion, seated This exercise is sometimes called table slides. In this exercise, you raise your arm in front of your body until you feel a stretch in your injured shoulder. Sit in a stable chair so your left / right forearm can rest on a flat surface. Your elbow should rest at a height that keeps your upper arm next to your body. Keeping your left / right shoulder relaxed, lean forward at the waist and let your hand slide forward (flexion). Stop when you feel a stretch in your shoulder, or when you reach the angle that is recommended by your health care provider. Hold for 5 seconds. Slowly return to the starting position. Repeat 10 times. Complete this exercise 1-2  times a day.       Shoulder flexion, standing In this exercise, you raise your arm in front of your body (flexion) until you feel  a stretch in your injured shoulder. Stand and hold a broomstick, a cane, or a similar object. Place your hands a little more than shoulder-width apart on the object. Your left / right hand should be palm-up, and your other hand should be palm-down. Keep your elbow straight and your shoulder muscles relaxed. Push the stick up with your healthy arm to raise your left / right arm in front of your body, and then over your head until you feel a stretch in your shoulder. Avoid shrugging your shoulder while you raise your arm. Keep your shoulder blade tucked down toward the middle of your back. Keep your left / right shoulder muscles relaxed. Hold for 10 seconds. Slowly return to the starting position. Repeat 10 times. Complete this exercise 1-2 times a day.      Shoulder abduction, active-assisted You will need a stick, broom handle, or similar object to help you (assist) in doing this exercise. Lie on your back. This is the supine position. Hold a broomstick, a cane, or a similar object. Place your hands a little more than shoulder-width apart on the object. Your left / right hand should be palm-up, and your other hand should be palm-down. Keeping your shoulder relaxed, push the stick to raise your left / right arm out to your side (abduction) and then over your head. Use your other hand to help move the stick. Stop when you feel a stretch in your shoulder, or when you reach the angle that is recommended by your health care provider. Avoid shrugging your shoulder while you raise your arm. Keep your shoulder blade tucked down toward the middle of your back. Hold for 10 seconds. Slowly return to the starting position. Repeat 10 times. Complete this exercise 1-2 times a day.      Shoulder flexion, active-assisted Lie on your back. You may bend your knees for comfort. Hold a broomstick, a cane, or a similar object so that your hands are about shoulder-width apart. Your palms should face toward your  feet. Raise your left / right arm over your head, then behind your head toward the floor (flexion). Use your other hand to help you do this (active-assisted). Stop when you feel a gentle stretch in your shoulder, or when you reach the angle that is recommended by your health care provider. Hold for 10 seconds. Use the stick and your other arm to help you return your left /  right arm to the starting position. Repeat 10 times. Complete this exercise 1-2 times a day.      External rotation Sit in a stable chair without armrests, or stand up. Tuck a soft object, such as a folded towel or a small ball, under your left / right upper arm. Hold a broomstick, a cane, or a similar object with your palms face-down, toward the floor. Bend your elbows to a 90-degree angle (right angle), and keep your hands about shoulder-width apart. Straighten your healthy arm and push the stick across your body, toward your left / right side. Keep your left / right arm bent. This will rotate your left / right forearm away from your body (external rotation). Hold for 10 seconds. Slowly return to the starting position. Repeat 10 times. Complete this exercise 1-2 times a day.        Strengthening exercises These exercises build strength and endurance in your shoulder. Endurance is the ability to use your muscles for a long time, even after they get tired. Do not start doing these exercises until your health care provider approves. Shoulder flexion, isometric Stand or sit in a doorway, facing the door frame. Keep your left / right arm straight and make a gentle fist with your hand. Place your fist against the door frame. Only your fist should be touching the frame. Keep your upper arm at your side. Gently press your fist against the door frame, as if you are trying to raise your arm above your head (isometric shoulder flexion). Avoid shrugging your shoulder while you press your hand into the door frame. Keep your shoulder  blade tucked down toward the middle of your back. Hold for 10 seconds. Slowly release the tension, and relax your muscles completely before you repeat the exercise. Repeat 10 times. Complete this exercise 3 times per week.      Shoulder abduction, isometric Stand or sit in a doorway. Your left / right arm should be closest to the door frame. Keep your left / right arm straight, and place the back of your hand against the door frame. Only your hand should be touching the frame. Keep the rest of your arm close to your side. Gently press the back of your hand against the door frame, as if you are trying to raise your arm out to the side (isometric shoulder abduction). Avoid shrugging your shoulder while you press your hand into the door frame. Keep your shoulder blade tucked down toward the middle of your back. Hold for 10 seconds. Slowly release the tension, and relax your muscles completely before you repeat the exercise. Repeat 10 times. Complete this exercise 3 times per week.      Internal rotation, isometric This is an exercise in which you press your palm against a door frame without moving your shoulder joint (isometric). Stand or sit in a doorway, facing the door frame. Bend your left / right elbow, and place the palm of your hand against the door frame. Only your palm should be touching the frame. Keep your upper arm at your side. Gently press your hand against the door frame, as if you are trying to push your arm toward your abdomen (internal rotation). Gradually increase the pressure until you are pressing as hard as you can. Stop increasing the pressure if you feel shoulder pain. Avoid shrugging your shoulder while you press your hand into the door frame. Keep your shoulder blade tucked down toward the middle of your back. Hold for  10 seconds. Slowly release the tension, and relax your muscles completely before you repeat the exercise. Repeat 10 times. Complete this exercise 3 times  per week.      External rotation, isometric This is an exercise in which you press the back of your wrist against a door frame without moving your shoulder joint (isometric). Stand or sit in a doorway, facing the door frame. Bend your left / right elbow and place the back of your wrist against the door frame. Only the back of your wrist should be touching the frame. Keep your upper arm at your side. Gently press your wrist against the door frame, as if you are trying to push your arm away from your abdomen (external rotation). Gradually increase the pressure until you are pressing as hard as you can. Stop increasing the pressure if you feel pain. Avoid shrugging your shoulder while you press your wrist into the door frame. Keep your shoulder blade tucked down toward the middle of your back. Hold for 10 seconds. Slowly release the tension, and relax your muscles completely before you repeat the exercise. Repeat 10 times. Complete this exercise 3 times per week.       Scapular retraction Sit in a stable chair without armrests, or stand up. Secure an exercise band to a stable object in front of you so the band is at shoulder height. Hold one end of the exercise band in each hand. Your palms should face down. Squeeze your shoulder blades together (retraction) and move your elbows slightly behind you. Do not shrug your shoulders upward while you do this. Hold for 10 seconds. Slowly return to the starting position. Repeat 10 times. Complete this exercise 3 times per week.      Shoulder extension Sit in a stable chair without armrests, or stand up. Secure an exercise band to a stable object in front of you so the band is above shoulder height. Hold one end of the exercise band in each hand. Straighten your elbows and lift your hands up to shoulder height. Squeeze your shoulder blades together and pull your hands down to the sides of your thighs (extension). Stop when your hands are straight  down by your sides. Do not let your hands go behind your body. Hold for 10 seconds. Slowly return to the starting position. Repeat 10 times. Complete this exercise 3 times per week.       Scapular protraction, supine Lie on your back on a firm surface (supine position). Hold a 5 lbs (or soup can) weight in your left / right hand. Raise your left / right arm straight into the air so your hand is directly above your shoulder joint. Push the weight into the air so your shoulder (scapula) lifts off the surface that you are lying on. The scapula will push up or forward (protraction). Do not move your head, neck, or back. Hold for 10 seconds. Slowly return to the starting position. Let your muscles relax completely before you repeat this exercise.  Repeat 10 times. Complete this exercise 3 times per week.

## 2022-07-10 ENCOUNTER — Ambulatory Visit: Payer: Medicare HMO | Admitting: Orthopedic Surgery

## 2022-08-18 ENCOUNTER — Encounter: Payer: Self-pay | Admitting: Orthopedic Surgery

## 2022-08-18 ENCOUNTER — Ambulatory Visit: Payer: Medicare HMO | Admitting: Orthopedic Surgery

## 2022-08-18 VITALS — BP 142/90 | HR 86 | Ht 71.0 in | Wt 178.0 lb

## 2022-08-18 DIAGNOSIS — M1712 Unilateral primary osteoarthritis, left knee: Secondary | ICD-10-CM

## 2022-08-18 NOTE — Progress Notes (Signed)
Return patient Visit  Assessment: Ebony Shaw is a 66 y.o. female with the following: 1. 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  She states that she had severe pain and swelling in the left knee, approximately 2 weeks ago.  Since then, her pain and swelling has drastically improved.  As result, we will defer further treatment at this time.  If her knee pain returns, we will consider repeat injection, as she has had an excellent response previously.  We have encouraged her to contact the clinic if her knee pain returns, and specifically ask for an injection appointment.    Follow-up: Return if symptoms worsen or fail to improve.  Subjective:  Chief Complaint  Patient presents with   Knee Pain    Lt knee pain approx 2 wks ago feels like it's gotten better over the past wk.     History of Present Illness: Ebony Shaw is a 66 y.o. female who returns for evaluation of left knee pain.  I saw her in clinic for the same complaint, approximately 9 months ago.  Her left knee was injected at that time, and we were able to aspirate some fluid.  She has done very well since then.  Approximately 2 weeks ago, she notes acute worsening of pain, with swelling in her left knee.  No specific injury.  She denies a twisting event.  However, since the onset of her pain and swelling, her pain has drastically improved.  She states that her knee feels a lot better at this time.  She denies swelling in her left knee.   Review of Systems: No fevers or chills No numbness or tingling No chest pain No shortness of breath No bowel or bladder dysfunction No GI distress No headaches    Objective: BP (!) 142/90   Pulse 86   Ht 5\' 11"  (1.803 m)   Wt 178 lb (80.7 kg)   BMI 24.83 kg/m   Physical Exam:  General: Alert and oriented. and No acute distress. Gait: Normal gait.  Left knee without effusion.  No redness is appreciated.  The knee is not warm.  Arc of motion  is approximately from 0-120 degrees.    Negative Lachman.  Minimal tenderness to palpation on the medial lateral joint line.  IMAGING: I personally ordered and reviewed the following images  No new imaging obtained today.  New Medications:  No orders of the defined types were placed in this encounter.     , MD  08/18/2022 9:00 AM

## 2022-10-07 DIAGNOSIS — N1831 Chronic kidney disease, stage 3a: Secondary | ICD-10-CM | POA: Insufficient documentation

## 2022-10-07 DIAGNOSIS — R7303 Prediabetes: Secondary | ICD-10-CM | POA: Insufficient documentation

## 2022-10-07 DIAGNOSIS — R7989 Other specified abnormal findings of blood chemistry: Secondary | ICD-10-CM | POA: Insufficient documentation

## 2022-11-05 ENCOUNTER — Encounter: Payer: Self-pay | Admitting: Radiology

## 2022-11-22 IMAGING — CT CT HEAD W/O CM
3 of 4 series · 16 of 47 positions shown, 19 images · non-contrast
Comparison: 1888

CLINICAL DATA: Head trauma, moderate-severe



[Series 2: head w o · axial · 0.43mm/px · z∈[+1585,+1715]mm · 10 of 32 slices shown, 13 images]
[im 3/32  brain]
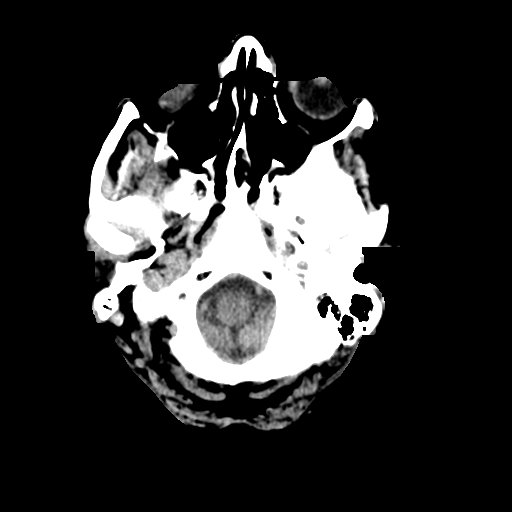
[im 3/32  bone]
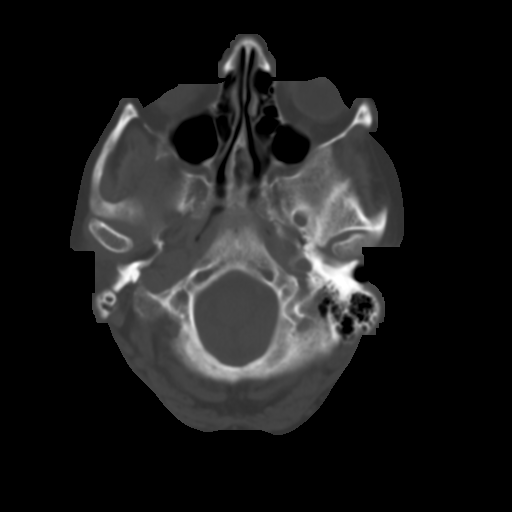
[im 5/32  brain]
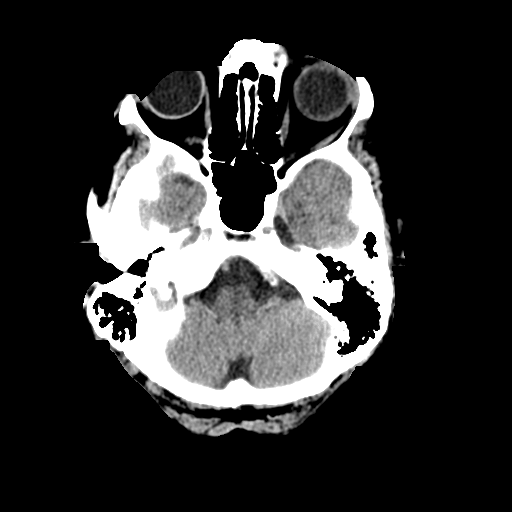
[im 9/32  brain]
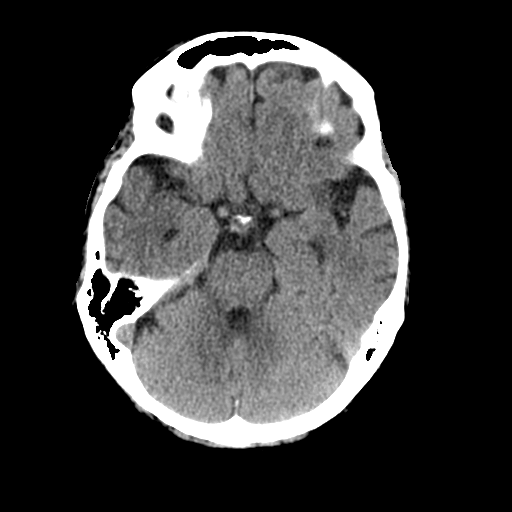
[im 12/32  brain]
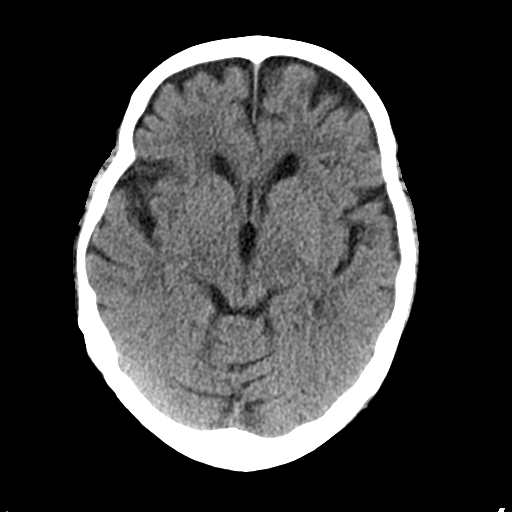
[im 14/32  brain]
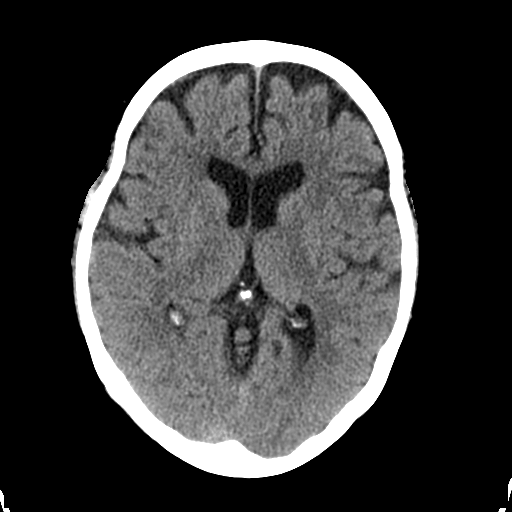
[im 14/32  bone]
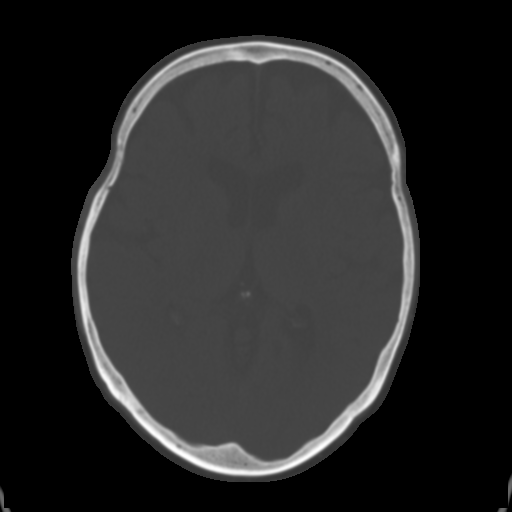
[im 18/32  brain]
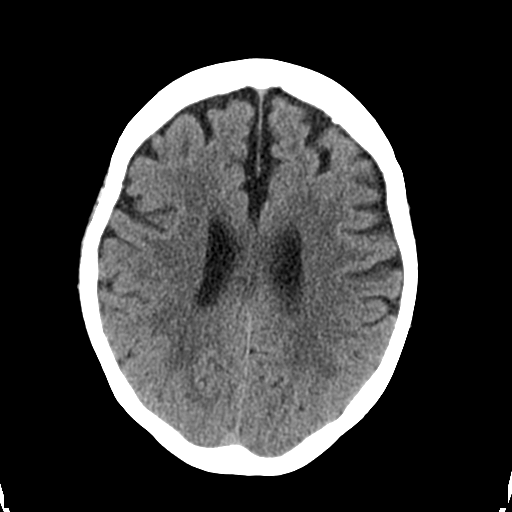
[im 20/32  brain]
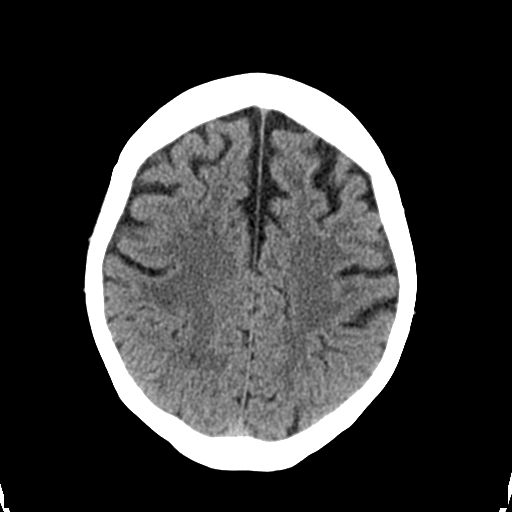
[im 23/32  brain]
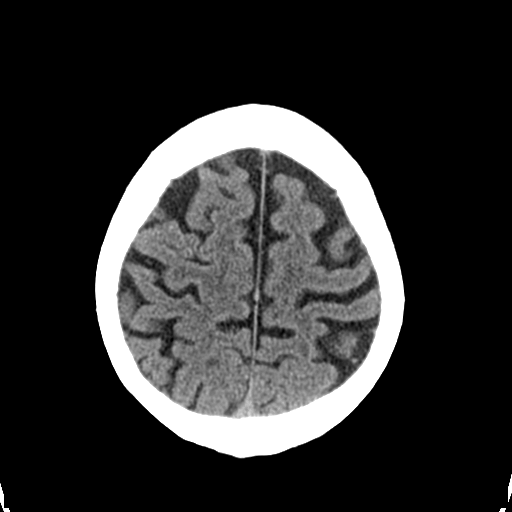
[im 27/32  brain]
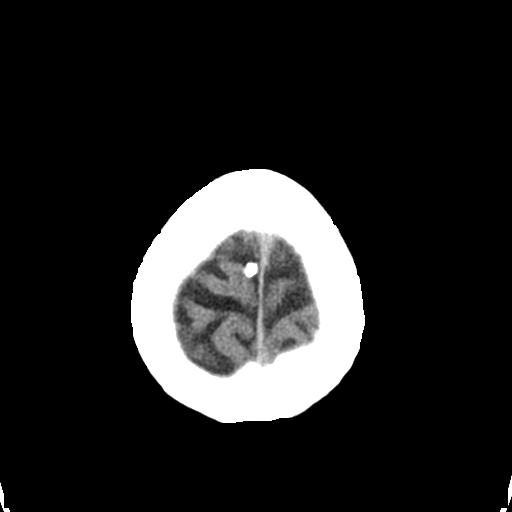
[im 27/32  bone]
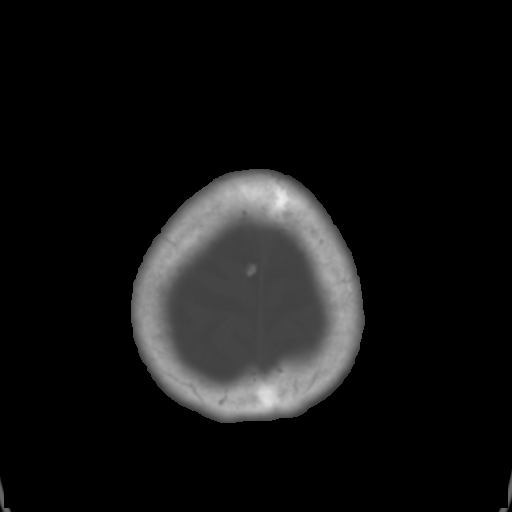
[im 29/32  brain]
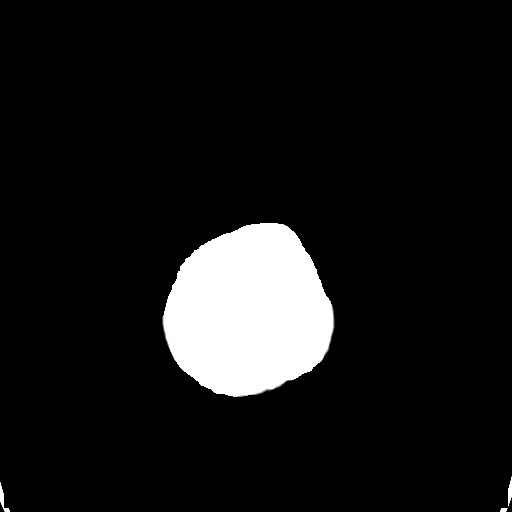

[Series 4: coronal soft · coronal · 0.32mm/px · 3 of 66 slices shown]
[im 22/66  brain]
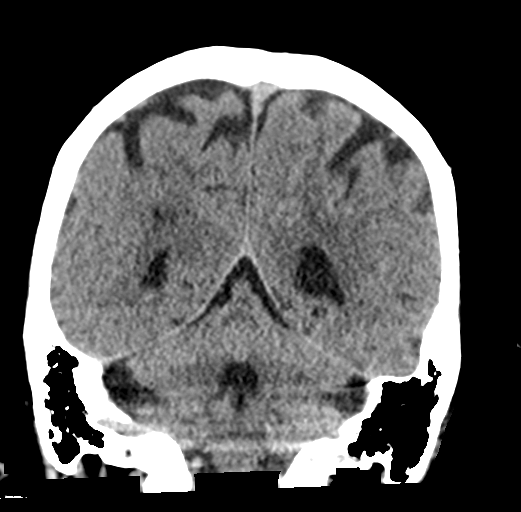
[im 29/66  brain]
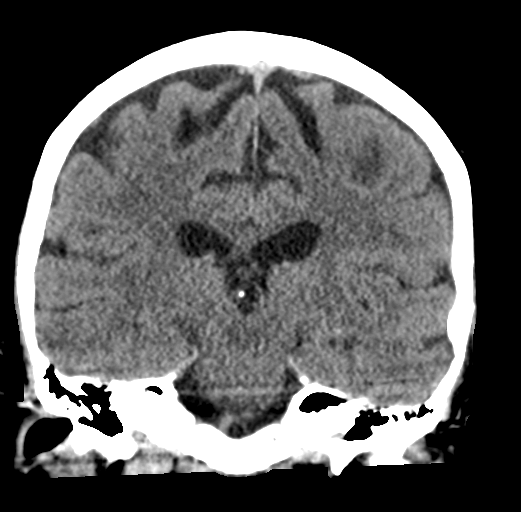
[im 37/66  brain]
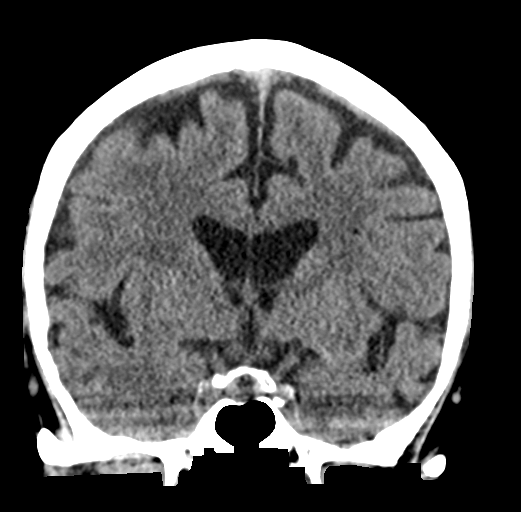

[Series 5: sagittal soft · sagittal · 0.36mm/px · 3 of 55 slices shown]
[im 19/55  brain]
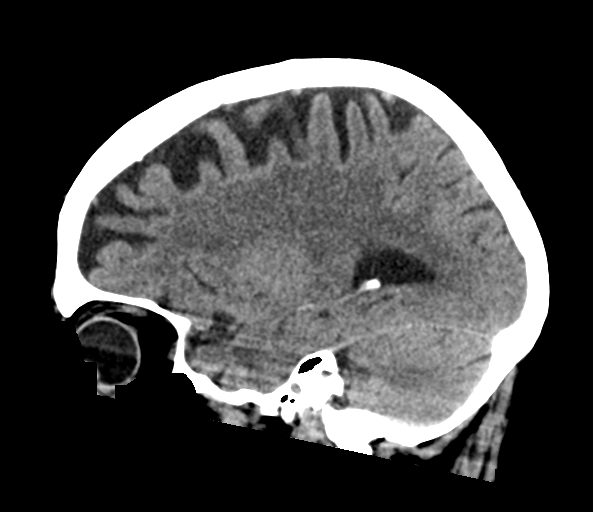
[im 28/55  brain]
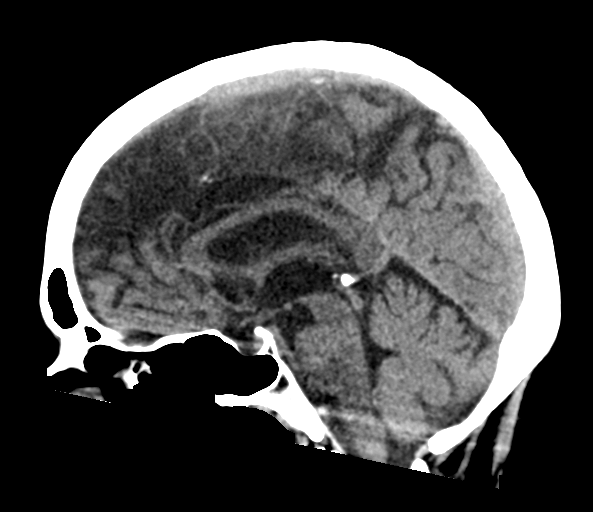
[im 37/55  brain]
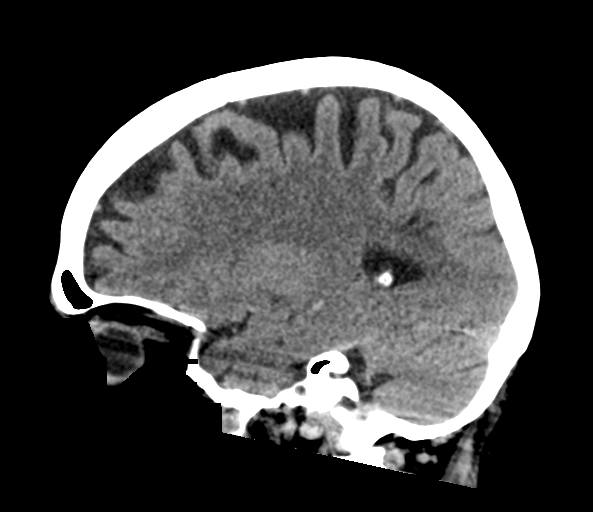

[16 of 47 positions shown; findings below may reference images not displayed]

FINDINGS: Brain: There is no acute intracranial hemorrhage, mass effect, or
edema. Gray-white differentiation is preserved. There is no
extra-axial fluid collection. Prominence of the ventricles and sulci
reflects minor parenchymal volume loss. Patchy hypoattenuation in
the supratentorial white matter is nonspecific but may reflect mild
chronic microvascular ischemic changes. These findings are similar
to the prior study.

Vascular: There is atherosclerotic calcification at the skull base.

Skull: Calvarium is unremarkable.

Sinuses/Orbits: No acute finding.

Other: None.
IMPRESSION: No evidence of acute intracranial injury.

## 2022-12-08 ENCOUNTER — Encounter: Payer: Self-pay | Admitting: Orthopedic Surgery

## 2022-12-08 ENCOUNTER — Ambulatory Visit (INDEPENDENT_AMBULATORY_CARE_PROVIDER_SITE_OTHER): Payer: Medicare HMO | Admitting: Orthopedic Surgery

## 2022-12-08 DIAGNOSIS — M1712 Unilateral primary osteoarthritis, left knee: Secondary | ICD-10-CM | POA: Diagnosis not present

## 2022-12-08 NOTE — Patient Instructions (Signed)

## 2022-12-10 NOTE — Progress Notes (Signed)
Return patient Visit  Assessment: Ebony Shaw is a 67 y.o. female with the following: 1. Arthritis of left knee  Plan: Mrs. Nunnelley has known arthritis in her left knee.  She has previously had an injection of the left knee, with excellent response.  She is interested in proceeding with an injection in clinic today.  This was completed without issues.  She will follow-up as needed.   Procedure note injection Left knee joint   Verbal consent was obtained to inject the left knee joint  Timeout was completed to confirm the site of injection.  The skin was prepped with alcohol and ethyl chloride was sprayed at the injection site.  A 21-gauge needle was used to inject 40 mg of Depo-Medrol and 1% lidocaine (3 cc) into the left knee using an anterolateral approach.  There were no complications. A sterile bandage was applied.   Follow-up: Return if symptoms worsen or fail to improve.  Subjective:  Chief Complaint  Patient presents with   Injections    Left knee   NDC: [id number]    History of Present Illness: Ebony Shaw is a 67 y.o. female who returns for evaluation of left knee pain.  Over the past couple weeks, she has noted progressively worsening pain in the left knee.  Previously, she has similar type pains, but notes gradual resolution.  However, this time the pain has persisted.  Have seen her once recently, and the pain had sufficiently improved, she declined an injection.  However, she returns to clinic today and is interested in an ED follow-up injection.   Review of Systems: No fevers or chills No numbness or tingling No chest pain No shortness of breath No bowel or bladder dysfunction No GI distress No headaches  Objective: There were no vitals taken for this visit.  Physical Exam:  General: Alert and oriented. and No acute distress. Gait: Left sided antalgic gait.  Left knee with minimal effusion.  No redness is appreciated.  The knee is not warm.  Arc  of motion is approximately from 5-110 degrees.  Tenderness to palpation along the medial joint line.  Increased pain with hyperflexion.  Negative Lachman.  No increased laxity varus or valgus stress.  No bruising is appreciated  IMAGING: I personally ordered and reviewed the following images  No new imaging today  New Medications:  No orders of the defined types were placed in this encounter.     Mordecai Rasmussen, MD  12/10/2022 12:01 AM

## 2022-12-18 ENCOUNTER — Ambulatory Visit: Payer: Medicare HMO | Admitting: Orthopedic Surgery

## 2022-12-18 ENCOUNTER — Encounter: Payer: Self-pay | Admitting: Orthopedic Surgery

## 2022-12-18 DIAGNOSIS — M1712 Unilateral primary osteoarthritis, left knee: Secondary | ICD-10-CM | POA: Diagnosis not present

## 2022-12-18 MED ORDER — TRAMADOL HCL 50 MG PO TABS: 50.0000 mg | ORAL_TABLET | Freq: Two times a day (BID) | ORAL | 5 refills | Status: AC | PRN

## 2022-12-18 NOTE — Progress Notes (Signed)
Return patient Visit  Assessment: Ebony Shaw is a 67 y.o. female with the following: 1. Arthritis of left knee  Plan: Mrs. Havlik has known arthritis in her left knee.  The left knee was injected approximately 10 days ago.  She states she had some improvement in her symptoms for about 3 days, but now the pain has returned.  She states the injection did not help.  No recent injuries.  She has tenderness to palpation along the medial joint line, including the medial tibial plateau, as well as medial femoral condyle.  She may have some bone bruising in this area.  I provided her with some tramadol.  She has been fitted for a brace.  I will see her back in 6 weeks.    Follow-up: Return in about 6 weeks (around 01/29/2023).  Subjective:  Chief Complaint  Patient presents with   Knee Pain    Still having pain in left knee after receiving injection 12/08/22. Pt states her injection in Dec '23 she started feeling relief a lot sooner and lasted longer.     History of Present Illness: DOLORAS BERTO is a 67 y.o. female who returns for evaluation of left knee pain.  I saw her in clinic less than 2 weeks ago.  The left knee was injected.  She states that she had some improvement in her pain for a few days.  Since then, the pain is returned.  She reports the pain is severe at this time.  No recent injury.  Pain is worse within the medial compartment.  Review of Systems: No fevers or chills No numbness or tingling No chest pain No shortness of breath No bowel or bladder dysfunction No GI distress No headaches  Objective: There were no vitals taken for this visit.  Physical Exam:  General: Alert and oriented. and No acute distress. Gait: Left sided antalgic gait.  Left knee without swelling.  Tenderness to palpation along medial joint line.  Tenderness to palpation over both the medial femoral condyle, as well as the medial tibial plateau.  She continues to have good range of motion.  No  increased laxity to varus or valgus stress.  IMAGING: I personally ordered and reviewed the following images  No new imaging today  New Medications:  Meds ordered this encounter  Medications   traMADol (ULTRAM) 50 MG tablet    Sig: Take 1 tablet (50 mg total) by mouth every 12 (twelve) hours as needed.    Dispense:  30 tablet    Refill:  5      Oliver Barre, MD  12/18/2022 9:51 PM

## 2022-12-18 NOTE — Patient Instructions (Addendum)
Brace and protected weightbearing  Consider physical therapy  Tramadol as needed

## 2023-01-29 ENCOUNTER — Encounter: Payer: Self-pay | Admitting: Orthopedic Surgery

## 2023-01-29 ENCOUNTER — Ambulatory Visit: Payer: Medicare HMO | Admitting: Orthopedic Surgery

## 2023-01-29 DIAGNOSIS — M1712 Unilateral primary osteoarthritis, left knee: Secondary | ICD-10-CM | POA: Diagnosis not present

## 2023-01-29 NOTE — Progress Notes (Signed)
Return patient Visit  Assessment: Ebony Shaw is a 67 y.o. female with the following: 1. Arthritis of left knee  Plan: Ebony Shaw has known arthritis in her left knee.  Since I saw her last in clinic, she states her pain is improving.  On physical exam today, she does have an effusion.  She notes some pain in the front of the knee with flexion and extension.  We discussed aspirating the left knee which could help alleviate some of the pressure and some of the pain she is having, especially with movement.  She is interested.  This was completed in clinic today.  She will follow-up as needed.  Procedure note - aspiration left knee joint   Verbal consent was obtained to aspirate the left knee joint  Timeout was completed to confirm the site of aspiration. The skin was prepped with alcohol and ethyl chloride was sprayed at the aspiration site.  An 18-gauge needle was inserted and 50 cc of clear joint fluid, with some blood was aspirated using a superolateral approach.  There were no complications. A sterile bandage was applied.     Follow-up: No follow-ups on file.  Subjective:  Chief Complaint  Patient presents with   Knee Pain    L knee feeling better     History of Present Illness: Ebony Shaw is a 67 y.o. female who returns for evaluation of left knee pain.  I saw her in clinic 6 weeks ago.  At that time, she was having worsening pain despite a recent injection.  She had tenderness to palpation over the left medial femoral condyle, as well as the medial tibial plateau.  Since then, she has been wearing her brace.  She notes improvement in her pain.  She has now been without the brace for the past 3 days.  She is taking medicines as needed.  She does note some pain with motion, and feels as though her left knee is swollen.  Review of Systems: No fevers or chills No numbness or tingling No chest pain No shortness of breath No bowel or bladder dysfunction No GI distress No  headaches  Objective: There were no vitals taken for this visit.  Physical Exam:  General: Alert and oriented. and No acute distress. Gait: Left sided antalgic gait.  Left knee with effusion.  No tenderness to palpation along medial joint line.  No tenderness to palpation over both the medial femoral condyle, as well as the medial tibial plateau.  She has pain in the anterior knee with full extension.  She has pain with flexion beyond 110 degrees.  Negative Lachman.  No increased laxity varus valgus stress.   IMAGING: I personally ordered and reviewed the following images  No new imaging today  New Medications:  No orders of the defined types were placed in this encounter.     Oliver Barre, MD  01/29/2023 9:01 AM

## 2023-01-29 NOTE — Patient Instructions (Signed)
Instructions Following Joint aspirations  In clinic today, you received an aspiration in one of your joints (sometimes more than one).  Occasionally, you can have some pain at the injection site, this is normal.  You can place ice at the injection site, or take over-the-counter medications such as Tylenol (acetaminophen) or Advil (ibuprofen).  Please follow all directions listed on the bottle.  If your joint (knee or shoulder) becomes swollen, red or very painful, please contact the clinic for additional assistance.
